# Patient Record
Sex: Female | Born: 1965 | Race: Black or African American | Hispanic: No | Marital: Single | Smoking: Current some day smoker
Health system: Southern US, Community
[De-identification: ages and names within clinical notes are randomized; demographics above are authoritative.]

## PROBLEM LIST (undated history)

## (undated) DIAGNOSIS — I10 Essential (primary) hypertension: Secondary | ICD-10-CM

## (undated) DIAGNOSIS — M5030 Other cervical disc degeneration, unspecified cervical region: Secondary | ICD-10-CM

## (undated) HISTORY — PX: JOINT REPLACEMENT: SHX530

---

## 1998-04-21 ENCOUNTER — Encounter: Payer: Self-pay | Admitting: Emergency Medicine

## 1998-04-21 ENCOUNTER — Emergency Department (HOSPITAL_COMMUNITY): Admission: EM | Admit: 1998-04-21 | Discharge: 1998-04-21 | Payer: Self-pay | Admitting: Emergency Medicine

## 1998-06-14 ENCOUNTER — Encounter: Payer: Self-pay | Admitting: Emergency Medicine

## 1998-06-14 ENCOUNTER — Emergency Department (HOSPITAL_COMMUNITY): Admission: EM | Admit: 1998-06-14 | Discharge: 1998-06-14 | Payer: Self-pay | Admitting: Emergency Medicine

## 1998-10-30 ENCOUNTER — Emergency Department (HOSPITAL_COMMUNITY): Admission: EM | Admit: 1998-10-30 | Discharge: 1998-10-30 | Payer: Self-pay | Admitting: Emergency Medicine

## 1998-11-27 ENCOUNTER — Emergency Department (HOSPITAL_COMMUNITY): Admission: EM | Admit: 1998-11-27 | Discharge: 1998-11-27 | Payer: Self-pay | Admitting: *Deleted

## 1998-12-05 ENCOUNTER — Emergency Department (HOSPITAL_COMMUNITY): Admission: EM | Admit: 1998-12-05 | Discharge: 1998-12-05 | Payer: Self-pay | Admitting: Internal Medicine

## 1999-05-11 ENCOUNTER — Emergency Department (HOSPITAL_COMMUNITY): Admission: EM | Admit: 1999-05-11 | Discharge: 1999-05-11 | Payer: Self-pay | Admitting: Emergency Medicine

## 1999-05-11 ENCOUNTER — Encounter: Payer: Self-pay | Admitting: Emergency Medicine

## 2000-02-14 ENCOUNTER — Encounter: Admission: RE | Admit: 2000-02-14 | Discharge: 2000-02-14 | Payer: Self-pay | Admitting: Hematology and Oncology

## 2000-02-23 ENCOUNTER — Emergency Department (HOSPITAL_COMMUNITY): Admission: EM | Admit: 2000-02-23 | Discharge: 2000-02-24 | Payer: Self-pay | Admitting: Emergency Medicine

## 2000-02-23 ENCOUNTER — Encounter: Payer: Self-pay | Admitting: Emergency Medicine

## 2000-02-28 ENCOUNTER — Encounter: Admission: RE | Admit: 2000-02-28 | Discharge: 2000-02-28 | Payer: Self-pay | Admitting: Hematology and Oncology

## 2000-04-08 ENCOUNTER — Encounter: Payer: Self-pay | Admitting: Emergency Medicine

## 2000-04-08 ENCOUNTER — Emergency Department (HOSPITAL_COMMUNITY): Admission: EM | Admit: 2000-04-08 | Discharge: 2000-04-08 | Payer: Self-pay | Admitting: Emergency Medicine

## 2000-04-16 ENCOUNTER — Other Ambulatory Visit: Admission: RE | Admit: 2000-04-16 | Discharge: 2000-04-16 | Payer: Self-pay | Admitting: Gynecology

## 2000-04-17 ENCOUNTER — Encounter: Admission: RE | Admit: 2000-04-17 | Discharge: 2000-04-17 | Payer: Self-pay | Admitting: Hematology and Oncology

## 2000-04-24 ENCOUNTER — Encounter: Payer: Self-pay | Admitting: Sports Medicine

## 2000-04-24 ENCOUNTER — Ambulatory Visit (HOSPITAL_COMMUNITY): Admission: RE | Admit: 2000-04-24 | Discharge: 2000-04-24 | Payer: Self-pay | Admitting: Sports Medicine

## 2000-04-30 ENCOUNTER — Other Ambulatory Visit: Admission: RE | Admit: 2000-04-30 | Discharge: 2000-04-30 | Payer: Self-pay | Admitting: Gynecology

## 2000-06-27 ENCOUNTER — Encounter: Admission: RE | Admit: 2000-06-27 | Discharge: 2000-06-27 | Payer: Self-pay | Admitting: Internal Medicine

## 2000-07-25 ENCOUNTER — Encounter: Admission: RE | Admit: 2000-07-25 | Discharge: 2000-07-25 | Payer: Self-pay | Admitting: Internal Medicine

## 2000-09-09 ENCOUNTER — Encounter: Admission: RE | Admit: 2000-09-09 | Discharge: 2000-09-09 | Payer: Self-pay | Admitting: Internal Medicine

## 2000-09-18 ENCOUNTER — Encounter: Admission: RE | Admit: 2000-09-18 | Discharge: 2000-09-18 | Payer: Self-pay | Admitting: Hematology and Oncology

## 2000-10-07 ENCOUNTER — Encounter: Admission: RE | Admit: 2000-10-07 | Discharge: 2000-10-07 | Payer: Self-pay | Admitting: Internal Medicine

## 2000-10-28 ENCOUNTER — Encounter: Admission: RE | Admit: 2000-10-28 | Discharge: 2000-10-28 | Payer: Self-pay | Admitting: Internal Medicine

## 2000-11-06 ENCOUNTER — Encounter: Admission: RE | Admit: 2000-11-06 | Discharge: 2000-11-06 | Payer: Self-pay | Admitting: Internal Medicine

## 2000-11-12 ENCOUNTER — Other Ambulatory Visit: Admission: RE | Admit: 2000-11-12 | Discharge: 2000-11-12 | Payer: Self-pay | Admitting: Gynecology

## 2002-01-17 ENCOUNTER — Emergency Department (HOSPITAL_COMMUNITY): Admission: EM | Admit: 2002-01-17 | Discharge: 2002-01-17 | Payer: Self-pay | Admitting: Emergency Medicine

## 2002-03-09 ENCOUNTER — Emergency Department (HOSPITAL_COMMUNITY): Admission: EM | Admit: 2002-03-09 | Discharge: 2002-03-09 | Payer: Self-pay | Admitting: *Deleted

## 2002-04-08 ENCOUNTER — Encounter: Admission: RE | Admit: 2002-04-08 | Discharge: 2002-04-08 | Payer: Self-pay | Admitting: Internal Medicine

## 2002-05-22 ENCOUNTER — Encounter: Admission: RE | Admit: 2002-05-22 | Discharge: 2002-05-22 | Payer: Self-pay | Admitting: Internal Medicine

## 2003-02-15 ENCOUNTER — Encounter: Admission: RE | Admit: 2003-02-15 | Discharge: 2003-02-15 | Payer: Self-pay | Admitting: Internal Medicine

## 2003-09-02 ENCOUNTER — Encounter: Admission: RE | Admit: 2003-09-02 | Discharge: 2003-09-02 | Payer: Self-pay | Admitting: Internal Medicine

## 2003-09-02 ENCOUNTER — Ambulatory Visit (HOSPITAL_COMMUNITY): Admission: RE | Admit: 2003-09-02 | Discharge: 2003-09-02 | Payer: Self-pay | Admitting: Internal Medicine

## 2003-09-27 ENCOUNTER — Emergency Department (HOSPITAL_COMMUNITY): Admission: EM | Admit: 2003-09-27 | Discharge: 2003-09-27 | Payer: Self-pay | Admitting: Family Medicine

## 2003-11-21 ENCOUNTER — Emergency Department (HOSPITAL_COMMUNITY): Admission: EM | Admit: 2003-11-21 | Discharge: 2003-11-21 | Payer: Self-pay | Admitting: Emergency Medicine

## 2003-11-24 ENCOUNTER — Emergency Department (HOSPITAL_COMMUNITY): Admission: EM | Admit: 2003-11-24 | Discharge: 2003-11-24 | Payer: Self-pay | Admitting: Emergency Medicine

## 2004-06-30 ENCOUNTER — Emergency Department (HOSPITAL_COMMUNITY): Admission: EM | Admit: 2004-06-30 | Discharge: 2004-06-30 | Payer: Self-pay | Admitting: Emergency Medicine

## 2004-12-23 ENCOUNTER — Inpatient Hospital Stay (HOSPITAL_COMMUNITY): Admission: EM | Admit: 2004-12-23 | Discharge: 2004-12-27 | Payer: Self-pay | Admitting: Emergency Medicine

## 2004-12-24 ENCOUNTER — Ambulatory Visit: Payer: Self-pay | Admitting: Internal Medicine

## 2006-02-24 IMAGING — CR DG CHEST 2V
2 series · 2 of 2 positions shown · non-contrast
Comparison: 11/21/03.

CLINICAL DATA: Abdominal pain, nausea.
 CHEST - 2 VIEW:

[view not recorded (1 of 2)]
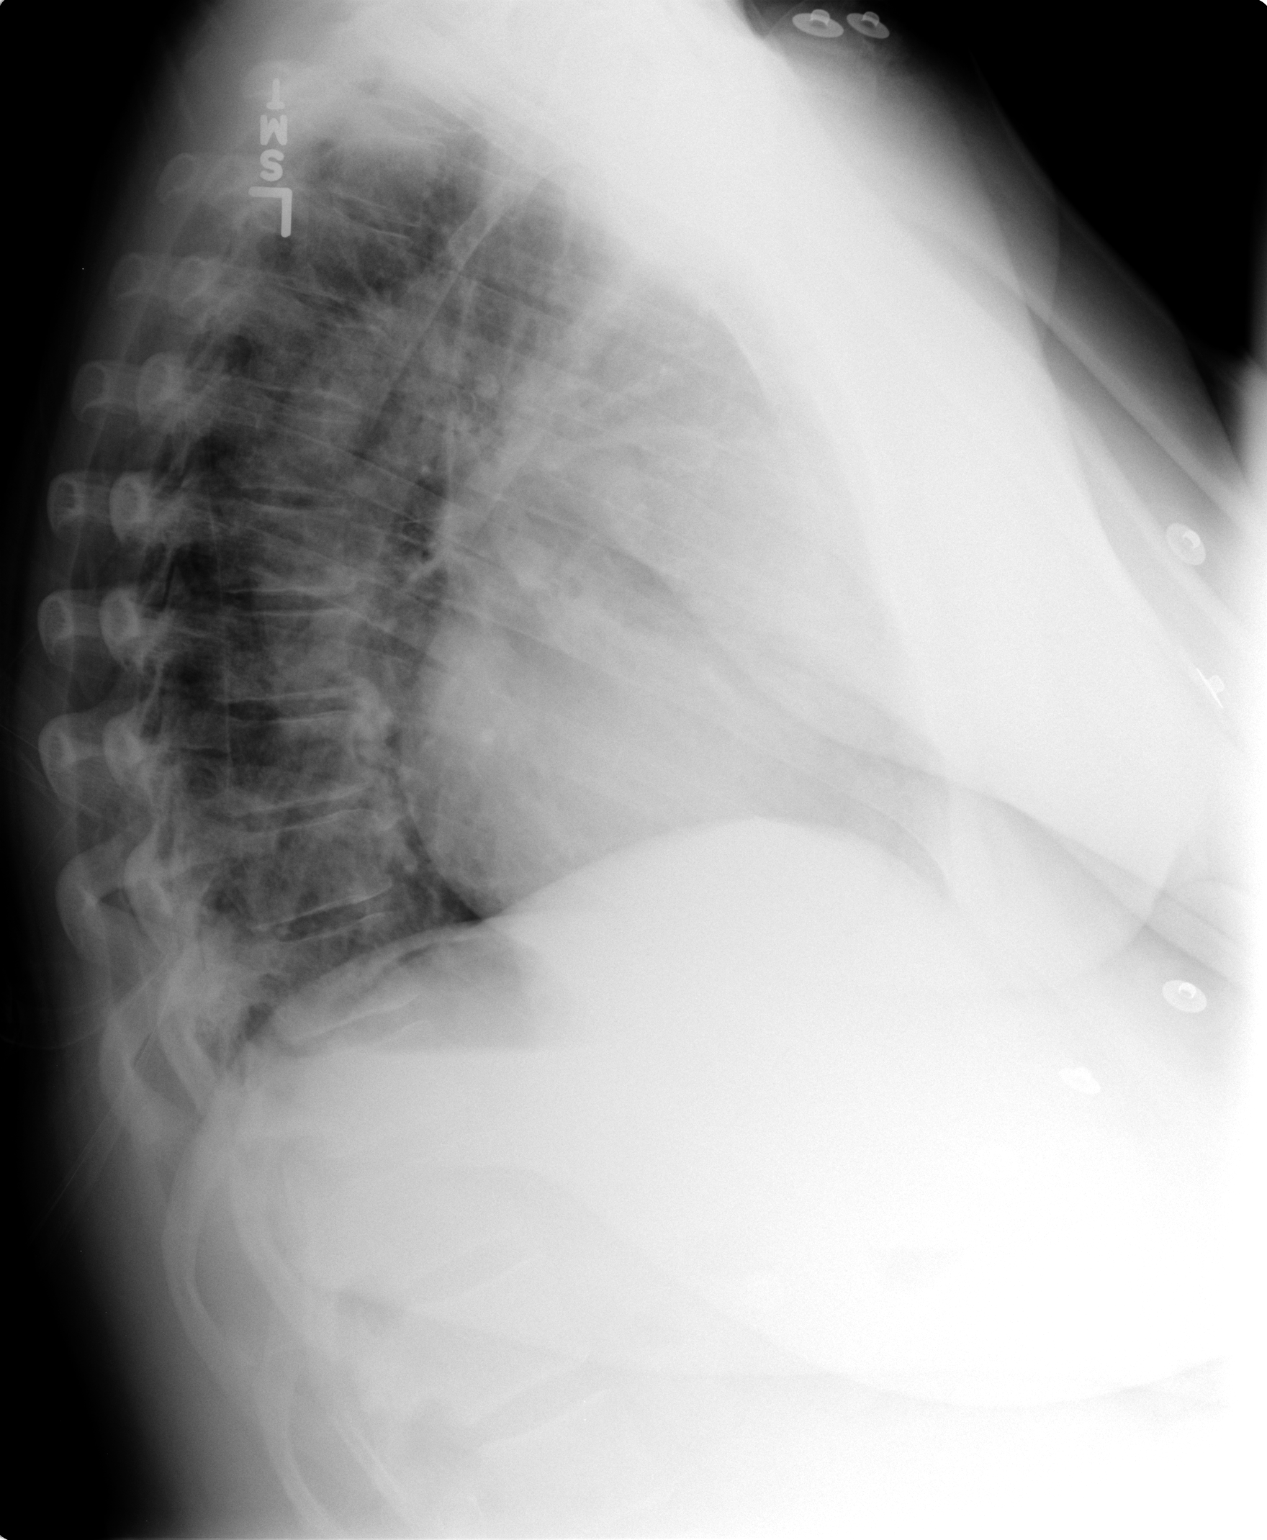

[view not recorded (2 of 2)]
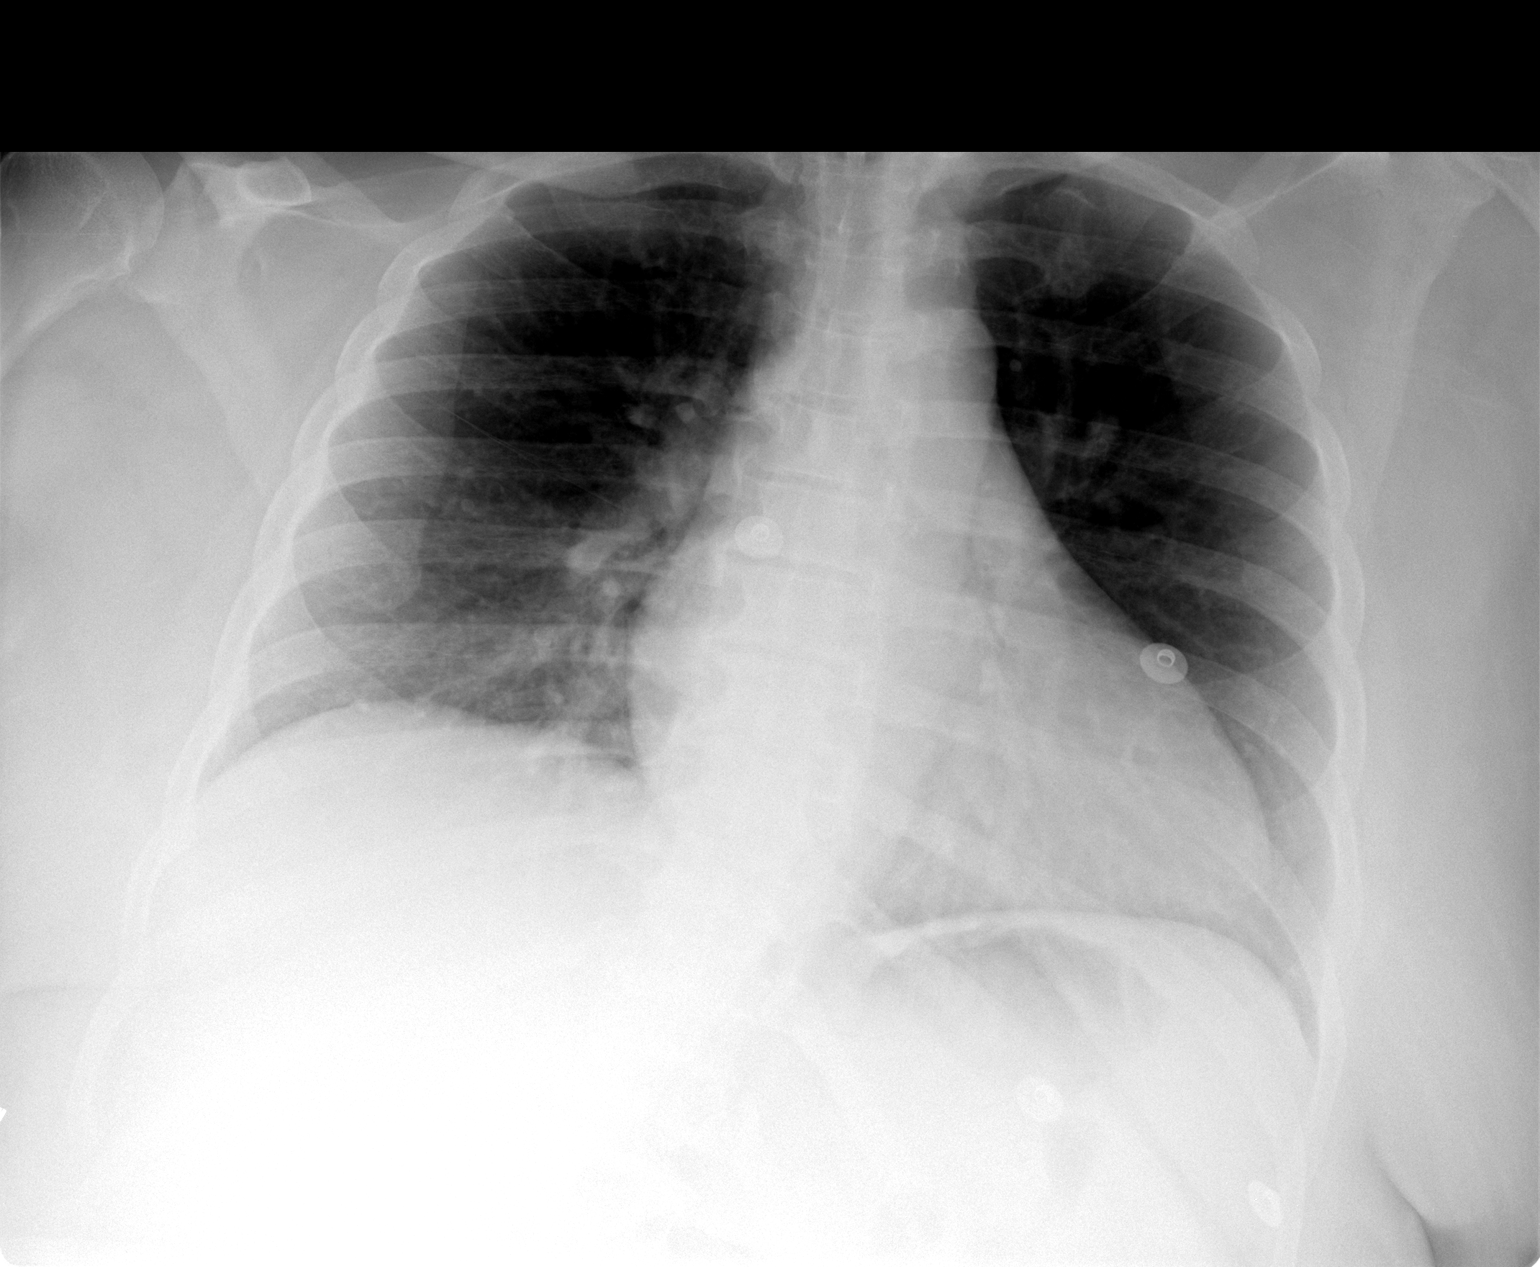

[2 of 2 positions shown; findings below may reference images not displayed]

There is stable cardiomegaly.  There are no infiltrative or edematous changes.  There are no mediastinal abnormalities.
IMPRESSION: Cardiomegaly ? stable.  No acute abnormalities.

## 2015-06-20 DIAGNOSIS — M545 Low back pain: Secondary | ICD-10-CM | POA: Diagnosis not present

## 2015-06-20 DIAGNOSIS — M170 Bilateral primary osteoarthritis of knee: Secondary | ICD-10-CM | POA: Diagnosis not present

## 2015-06-20 DIAGNOSIS — M25562 Pain in left knee: Secondary | ICD-10-CM | POA: Diagnosis not present

## 2015-06-20 DIAGNOSIS — M25561 Pain in right knee: Secondary | ICD-10-CM | POA: Diagnosis not present

## 2015-09-14 DIAGNOSIS — F99 Mental disorder, not otherwise specified: Secondary | ICD-10-CM | POA: Diagnosis not present

## 2015-09-14 DIAGNOSIS — F251 Schizoaffective disorder, depressive type: Secondary | ICD-10-CM | POA: Diagnosis not present

## 2015-09-14 DIAGNOSIS — F329 Major depressive disorder, single episode, unspecified: Secondary | ICD-10-CM | POA: Diagnosis not present

## 2015-09-14 DIAGNOSIS — F23 Brief psychotic disorder: Secondary | ICD-10-CM | POA: Diagnosis not present

## 2015-09-14 DIAGNOSIS — Z751 Person awaiting admission to adequate facility elsewhere: Secondary | ICD-10-CM | POA: Diagnosis not present

## 2015-09-15 DIAGNOSIS — F332 Major depressive disorder, recurrent severe without psychotic features: Secondary | ICD-10-CM | POA: Diagnosis present

## 2015-09-15 DIAGNOSIS — E669 Obesity, unspecified: Secondary | ICD-10-CM | POA: Diagnosis present

## 2015-09-15 DIAGNOSIS — I10 Essential (primary) hypertension: Secondary | ICD-10-CM | POA: Diagnosis present

## 2015-09-15 DIAGNOSIS — R45851 Suicidal ideations: Secondary | ICD-10-CM | POA: Diagnosis present

## 2015-09-15 DIAGNOSIS — F17210 Nicotine dependence, cigarettes, uncomplicated: Secondary | ICD-10-CM | POA: Diagnosis present

## 2015-09-15 DIAGNOSIS — K219 Gastro-esophageal reflux disease without esophagitis: Secondary | ICD-10-CM | POA: Diagnosis present

## 2015-09-15 DIAGNOSIS — Z751 Person awaiting admission to adequate facility elsewhere: Secondary | ICD-10-CM | POA: Diagnosis not present

## 2015-09-15 DIAGNOSIS — M519 Unspecified thoracic, thoracolumbar and lumbosacral intervertebral disc disorder: Secondary | ICD-10-CM | POA: Diagnosis present

## 2015-09-15 DIAGNOSIS — F329 Major depressive disorder, single episode, unspecified: Secondary | ICD-10-CM | POA: Diagnosis not present

## 2015-10-03 DIAGNOSIS — J40 Bronchitis, not specified as acute or chronic: Secondary | ICD-10-CM | POA: Diagnosis not present

## 2015-10-03 DIAGNOSIS — J309 Allergic rhinitis, unspecified: Secondary | ICD-10-CM | POA: Diagnosis not present

## 2015-10-03 DIAGNOSIS — I10 Essential (primary) hypertension: Secondary | ICD-10-CM | POA: Diagnosis not present

## 2015-10-10 DIAGNOSIS — R52 Pain, unspecified: Secondary | ICD-10-CM | POA: Diagnosis not present

## 2015-10-10 DIAGNOSIS — M25519 Pain in unspecified shoulder: Secondary | ICD-10-CM | POA: Diagnosis not present

## 2015-10-25 DIAGNOSIS — M19011 Primary osteoarthritis, right shoulder: Secondary | ICD-10-CM | POA: Diagnosis not present

## 2015-10-25 DIAGNOSIS — M19012 Primary osteoarthritis, left shoulder: Secondary | ICD-10-CM | POA: Diagnosis not present

## 2015-10-25 DIAGNOSIS — M7591 Shoulder lesion, unspecified, right shoulder: Secondary | ICD-10-CM | POA: Diagnosis not present

## 2015-10-28 DIAGNOSIS — M25519 Pain in unspecified shoulder: Secondary | ICD-10-CM | POA: Diagnosis not present

## 2015-10-28 DIAGNOSIS — R52 Pain, unspecified: Secondary | ICD-10-CM | POA: Diagnosis not present

## 2015-10-28 DIAGNOSIS — M25562 Pain in left knee: Secondary | ICD-10-CM | POA: Diagnosis not present

## 2015-10-31 DIAGNOSIS — M25562 Pain in left knee: Secondary | ICD-10-CM | POA: Diagnosis not present

## 2015-10-31 DIAGNOSIS — M25561 Pain in right knee: Secondary | ICD-10-CM | POA: Diagnosis not present

## 2015-10-31 DIAGNOSIS — M25512 Pain in left shoulder: Secondary | ICD-10-CM | POA: Diagnosis not present

## 2015-10-31 DIAGNOSIS — M25511 Pain in right shoulder: Secondary | ICD-10-CM | POA: Diagnosis not present

## 2015-11-04 DIAGNOSIS — F332 Major depressive disorder, recurrent severe without psychotic features: Secondary | ICD-10-CM | POA: Diagnosis not present

## 2015-11-07 DIAGNOSIS — M25562 Pain in left knee: Secondary | ICD-10-CM | POA: Diagnosis not present

## 2015-11-07 DIAGNOSIS — M170 Bilateral primary osteoarthritis of knee: Secondary | ICD-10-CM | POA: Diagnosis not present

## 2015-11-07 DIAGNOSIS — M25512 Pain in left shoulder: Secondary | ICD-10-CM | POA: Diagnosis not present

## 2015-11-07 DIAGNOSIS — M25561 Pain in right knee: Secondary | ICD-10-CM | POA: Diagnosis not present

## 2015-11-23 DIAGNOSIS — M25512 Pain in left shoulder: Secondary | ICD-10-CM | POA: Diagnosis not present

## 2015-11-23 DIAGNOSIS — M170 Bilateral primary osteoarthritis of knee: Secondary | ICD-10-CM | POA: Diagnosis not present

## 2015-11-23 DIAGNOSIS — M25511 Pain in right shoulder: Secondary | ICD-10-CM | POA: Diagnosis not present

## 2015-11-23 DIAGNOSIS — M25562 Pain in left knee: Secondary | ICD-10-CM | POA: Diagnosis not present

## 2015-11-23 DIAGNOSIS — M75101 Unspecified rotator cuff tear or rupture of right shoulder, not specified as traumatic: Secondary | ICD-10-CM | POA: Diagnosis not present

## 2015-11-23 DIAGNOSIS — M25561 Pain in right knee: Secondary | ICD-10-CM | POA: Diagnosis not present

## 2015-12-05 DIAGNOSIS — M2550 Pain in unspecified joint: Secondary | ICD-10-CM | POA: Diagnosis not present

## 2015-12-05 DIAGNOSIS — I10 Essential (primary) hypertension: Secondary | ICD-10-CM | POA: Diagnosis not present

## 2015-12-07 DIAGNOSIS — M25579 Pain in unspecified ankle and joints of unspecified foot: Secondary | ICD-10-CM | POA: Diagnosis not present

## 2015-12-07 DIAGNOSIS — M25571 Pain in right ankle and joints of right foot: Secondary | ICD-10-CM | POA: Diagnosis not present

## 2015-12-07 DIAGNOSIS — M19071 Primary osteoarthritis, right ankle and foot: Secondary | ICD-10-CM | POA: Diagnosis not present

## 2015-12-07 DIAGNOSIS — M19072 Primary osteoarthritis, left ankle and foot: Secondary | ICD-10-CM | POA: Diagnosis not present

## 2015-12-07 DIAGNOSIS — M25572 Pain in left ankle and joints of left foot: Secondary | ICD-10-CM | POA: Diagnosis not present

## 2015-12-15 DIAGNOSIS — M25512 Pain in left shoulder: Secondary | ICD-10-CM | POA: Diagnosis not present

## 2015-12-15 DIAGNOSIS — M25561 Pain in right knee: Secondary | ICD-10-CM | POA: Diagnosis not present

## 2015-12-15 DIAGNOSIS — M170 Bilateral primary osteoarthritis of knee: Secondary | ICD-10-CM | POA: Diagnosis not present

## 2015-12-15 DIAGNOSIS — M25562 Pain in left knee: Secondary | ICD-10-CM | POA: Diagnosis not present

## 2015-12-29 DIAGNOSIS — M1712 Unilateral primary osteoarthritis, left knee: Secondary | ICD-10-CM | POA: Diagnosis not present

## 2015-12-29 DIAGNOSIS — M1711 Unilateral primary osteoarthritis, right knee: Secondary | ICD-10-CM | POA: Diagnosis not present

## 2015-12-29 DIAGNOSIS — M25562 Pain in left knee: Secondary | ICD-10-CM | POA: Diagnosis not present

## 2015-12-29 DIAGNOSIS — M25561 Pain in right knee: Secondary | ICD-10-CM | POA: Diagnosis not present

## 2016-01-04 DIAGNOSIS — M1712 Unilateral primary osteoarthritis, left knee: Secondary | ICD-10-CM | POA: Diagnosis not present

## 2016-01-04 DIAGNOSIS — M1711 Unilateral primary osteoarthritis, right knee: Secondary | ICD-10-CM | POA: Diagnosis not present

## 2016-01-04 DIAGNOSIS — M62830 Muscle spasm of back: Secondary | ICD-10-CM | POA: Diagnosis not present

## 2016-01-13 DIAGNOSIS — M1711 Unilateral primary osteoarthritis, right knee: Secondary | ICD-10-CM | POA: Diagnosis not present

## 2016-01-13 DIAGNOSIS — M62830 Muscle spasm of back: Secondary | ICD-10-CM | POA: Diagnosis not present

## 2016-01-13 DIAGNOSIS — M1712 Unilateral primary osteoarthritis, left knee: Secondary | ICD-10-CM | POA: Diagnosis not present

## 2016-01-16 DIAGNOSIS — F332 Major depressive disorder, recurrent severe without psychotic features: Secondary | ICD-10-CM | POA: Diagnosis not present

## 2016-01-25 DIAGNOSIS — E860 Dehydration: Secondary | ICD-10-CM | POA: Diagnosis not present

## 2016-01-25 DIAGNOSIS — I169 Hypertensive crisis, unspecified: Secondary | ICD-10-CM | POA: Diagnosis not present

## 2016-01-25 DIAGNOSIS — N281 Cyst of kidney, acquired: Secondary | ICD-10-CM | POA: Diagnosis not present

## 2016-01-25 DIAGNOSIS — R1084 Generalized abdominal pain: Secondary | ICD-10-CM | POA: Diagnosis not present

## 2016-01-25 DIAGNOSIS — R112 Nausea with vomiting, unspecified: Secondary | ICD-10-CM | POA: Diagnosis not present

## 2016-01-25 DIAGNOSIS — Z6841 Body Mass Index (BMI) 40.0 and over, adult: Secondary | ICD-10-CM | POA: Diagnosis not present

## 2016-01-25 DIAGNOSIS — D7389 Other diseases of spleen: Secondary | ICD-10-CM | POA: Diagnosis not present

## 2016-01-25 DIAGNOSIS — K439 Ventral hernia without obstruction or gangrene: Secondary | ICD-10-CM | POA: Diagnosis not present

## 2016-01-25 DIAGNOSIS — R110 Nausea: Secondary | ICD-10-CM | POA: Diagnosis not present

## 2016-01-25 DIAGNOSIS — N390 Urinary tract infection, site not specified: Secondary | ICD-10-CM | POA: Diagnosis not present

## 2016-01-25 DIAGNOSIS — B9620 Unspecified Escherichia coli [E. coli] as the cause of diseases classified elsewhere: Secondary | ICD-10-CM | POA: Diagnosis not present

## 2016-01-25 DIAGNOSIS — I10 Essential (primary) hypertension: Secondary | ICD-10-CM | POA: Diagnosis not present

## 2016-01-25 DIAGNOSIS — R030 Elevated blood-pressure reading, without diagnosis of hypertension: Secondary | ICD-10-CM | POA: Diagnosis not present

## 2016-01-26 DIAGNOSIS — N390 Urinary tract infection, site not specified: Secondary | ICD-10-CM | POA: Diagnosis present

## 2016-01-26 DIAGNOSIS — Z888 Allergy status to other drugs, medicaments and biological substances status: Secondary | ICD-10-CM | POA: Diagnosis not present

## 2016-01-26 DIAGNOSIS — Z79899 Other long term (current) drug therapy: Secondary | ICD-10-CM | POA: Diagnosis not present

## 2016-01-26 DIAGNOSIS — R112 Nausea with vomiting, unspecified: Secondary | ICD-10-CM | POA: Diagnosis not present

## 2016-01-26 DIAGNOSIS — Z713 Dietary counseling and surveillance: Secondary | ICD-10-CM | POA: Diagnosis not present

## 2016-01-26 DIAGNOSIS — Z87891 Personal history of nicotine dependence: Secondary | ICD-10-CM | POA: Diagnosis not present

## 2016-01-26 DIAGNOSIS — M479 Spondylosis, unspecified: Secondary | ICD-10-CM | POA: Diagnosis present

## 2016-01-26 DIAGNOSIS — Z8249 Family history of ischemic heart disease and other diseases of the circulatory system: Secondary | ICD-10-CM | POA: Diagnosis not present

## 2016-01-26 DIAGNOSIS — B9620 Unspecified Escherichia coli [E. coli] as the cause of diseases classified elsewhere: Secondary | ICD-10-CM | POA: Diagnosis present

## 2016-01-26 DIAGNOSIS — I10 Essential (primary) hypertension: Secondary | ICD-10-CM | POA: Diagnosis present

## 2016-01-26 DIAGNOSIS — Z602 Problems related to living alone: Secondary | ICD-10-CM | POA: Diagnosis present

## 2016-01-26 DIAGNOSIS — I169 Hypertensive crisis, unspecified: Secondary | ICD-10-CM | POA: Diagnosis present

## 2016-01-26 DIAGNOSIS — Z6841 Body Mass Index (BMI) 40.0 and over, adult: Secondary | ICD-10-CM | POA: Diagnosis not present

## 2016-01-26 DIAGNOSIS — F418 Other specified anxiety disorders: Secondary | ICD-10-CM | POA: Diagnosis present

## 2016-02-22 DIAGNOSIS — N289 Disorder of kidney and ureter, unspecified: Secondary | ICD-10-CM | POA: Diagnosis not present

## 2016-02-22 DIAGNOSIS — I10 Essential (primary) hypertension: Secondary | ICD-10-CM | POA: Diagnosis not present

## 2016-02-22 DIAGNOSIS — R1111 Vomiting without nausea: Secondary | ICD-10-CM | POA: Diagnosis not present

## 2016-02-22 DIAGNOSIS — Z888 Allergy status to other drugs, medicaments and biological substances status: Secondary | ICD-10-CM | POA: Diagnosis not present

## 2016-02-22 DIAGNOSIS — R197 Diarrhea, unspecified: Secondary | ICD-10-CM | POA: Diagnosis not present

## 2016-02-22 DIAGNOSIS — N281 Cyst of kidney, acquired: Secondary | ICD-10-CM | POA: Diagnosis not present

## 2016-02-22 DIAGNOSIS — R112 Nausea with vomiting, unspecified: Secondary | ICD-10-CM | POA: Diagnosis not present

## 2016-02-22 DIAGNOSIS — Z79899 Other long term (current) drug therapy: Secondary | ICD-10-CM | POA: Diagnosis not present

## 2016-02-22 DIAGNOSIS — R030 Elevated blood-pressure reading, without diagnosis of hypertension: Secondary | ICD-10-CM | POA: Diagnosis not present

## 2016-02-22 DIAGNOSIS — K439 Ventral hernia without obstruction or gangrene: Secondary | ICD-10-CM | POA: Diagnosis not present

## 2016-02-22 DIAGNOSIS — R1013 Epigastric pain: Secondary | ICD-10-CM | POA: Diagnosis not present

## 2016-03-03 DIAGNOSIS — Z79899 Other long term (current) drug therapy: Secondary | ICD-10-CM | POA: Diagnosis not present

## 2016-03-03 DIAGNOSIS — R45851 Suicidal ideations: Secondary | ICD-10-CM | POA: Diagnosis not present

## 2016-03-03 DIAGNOSIS — M25559 Pain in unspecified hip: Secondary | ICD-10-CM | POA: Diagnosis not present

## 2016-03-03 DIAGNOSIS — E669 Obesity, unspecified: Secondary | ICD-10-CM | POA: Diagnosis not present

## 2016-03-03 DIAGNOSIS — R1013 Epigastric pain: Secondary | ICD-10-CM | POA: Diagnosis not present

## 2016-03-03 DIAGNOSIS — M79606 Pain in leg, unspecified: Secondary | ICD-10-CM | POA: Diagnosis not present

## 2016-03-03 DIAGNOSIS — R112 Nausea with vomiting, unspecified: Secondary | ICD-10-CM | POA: Diagnosis not present

## 2016-03-03 DIAGNOSIS — F329 Major depressive disorder, single episode, unspecified: Secondary | ICD-10-CM | POA: Diagnosis not present

## 2016-03-03 DIAGNOSIS — I10 Essential (primary) hypertension: Secondary | ICD-10-CM | POA: Diagnosis not present

## 2016-03-03 DIAGNOSIS — R1110 Vomiting, unspecified: Secondary | ICD-10-CM | POA: Diagnosis not present

## 2016-03-03 DIAGNOSIS — Z9109 Other allergy status, other than to drugs and biological substances: Secondary | ICD-10-CM | POA: Diagnosis not present

## 2016-03-03 DIAGNOSIS — K2970 Gastritis, unspecified, without bleeding: Secondary | ICD-10-CM | POA: Diagnosis not present

## 2016-03-03 DIAGNOSIS — R197 Diarrhea, unspecified: Secondary | ICD-10-CM | POA: Diagnosis not present

## 2016-03-05 DIAGNOSIS — F315 Bipolar disorder, current episode depressed, severe, with psychotic features: Secondary | ICD-10-CM | POA: Diagnosis present

## 2016-03-05 DIAGNOSIS — R1110 Vomiting, unspecified: Secondary | ICD-10-CM | POA: Diagnosis not present

## 2016-03-05 DIAGNOSIS — R197 Diarrhea, unspecified: Secondary | ICD-10-CM | POA: Diagnosis not present

## 2016-03-05 DIAGNOSIS — R45851 Suicidal ideations: Secondary | ICD-10-CM | POA: Diagnosis present

## 2016-03-05 DIAGNOSIS — Z62810 Personal history of physical and sexual abuse in childhood: Secondary | ICD-10-CM | POA: Diagnosis present

## 2016-03-05 DIAGNOSIS — F332 Major depressive disorder, recurrent severe without psychotic features: Secondary | ICD-10-CM | POA: Diagnosis not present

## 2016-03-05 DIAGNOSIS — F329 Major depressive disorder, single episode, unspecified: Secondary | ICD-10-CM | POA: Diagnosis not present

## 2016-03-05 DIAGNOSIS — M1990 Unspecified osteoarthritis, unspecified site: Secondary | ICD-10-CM | POA: Diagnosis present

## 2016-03-05 DIAGNOSIS — K219 Gastro-esophageal reflux disease without esophagitis: Secondary | ICD-10-CM | POA: Diagnosis present

## 2016-03-05 DIAGNOSIS — M069 Rheumatoid arthritis, unspecified: Secondary | ICD-10-CM | POA: Diagnosis present

## 2016-03-05 DIAGNOSIS — R1013 Epigastric pain: Secondary | ICD-10-CM | POA: Diagnosis not present

## 2016-03-05 DIAGNOSIS — I10 Essential (primary) hypertension: Secondary | ICD-10-CM | POA: Diagnosis present

## 2016-03-15 DIAGNOSIS — M1712 Unilateral primary osteoarthritis, left knee: Secondary | ICD-10-CM | POA: Diagnosis not present

## 2016-03-15 DIAGNOSIS — M62830 Muscle spasm of back: Secondary | ICD-10-CM | POA: Diagnosis not present

## 2016-03-15 DIAGNOSIS — M25561 Pain in right knee: Secondary | ICD-10-CM | POA: Diagnosis not present

## 2016-03-15 DIAGNOSIS — M1711 Unilateral primary osteoarthritis, right knee: Secondary | ICD-10-CM | POA: Diagnosis not present

## 2016-03-15 DIAGNOSIS — M25562 Pain in left knee: Secondary | ICD-10-CM | POA: Diagnosis not present

## 2016-03-15 DIAGNOSIS — M479 Spondylosis, unspecified: Secondary | ICD-10-CM | POA: Diagnosis not present

## 2016-04-09 DIAGNOSIS — M25561 Pain in right knee: Secondary | ICD-10-CM | POA: Diagnosis not present

## 2016-04-09 DIAGNOSIS — M479 Spondylosis, unspecified: Secondary | ICD-10-CM | POA: Diagnosis not present

## 2016-04-09 DIAGNOSIS — M1712 Unilateral primary osteoarthritis, left knee: Secondary | ICD-10-CM | POA: Diagnosis not present

## 2016-04-09 DIAGNOSIS — M62830 Muscle spasm of back: Secondary | ICD-10-CM | POA: Diagnosis not present

## 2016-04-09 DIAGNOSIS — M25562 Pain in left knee: Secondary | ICD-10-CM | POA: Diagnosis not present

## 2016-04-09 DIAGNOSIS — M1711 Unilateral primary osteoarthritis, right knee: Secondary | ICD-10-CM | POA: Diagnosis not present

## 2016-04-16 DIAGNOSIS — F3341 Major depressive disorder, recurrent, in partial remission: Secondary | ICD-10-CM | POA: Diagnosis not present

## 2016-05-09 DIAGNOSIS — R202 Paresthesia of skin: Secondary | ICD-10-CM | POA: Diagnosis not present

## 2016-05-09 DIAGNOSIS — S139XXA Sprain of joints and ligaments of unspecified parts of neck, initial encounter: Secondary | ICD-10-CM | POA: Diagnosis not present

## 2016-05-09 DIAGNOSIS — M25562 Pain in left knee: Secondary | ICD-10-CM | POA: Diagnosis not present

## 2016-05-09 DIAGNOSIS — M479 Spondylosis, unspecified: Secondary | ICD-10-CM | POA: Diagnosis not present

## 2016-05-09 DIAGNOSIS — G894 Chronic pain syndrome: Secondary | ICD-10-CM | POA: Diagnosis not present

## 2016-05-09 DIAGNOSIS — M25561 Pain in right knee: Secondary | ICD-10-CM | POA: Diagnosis not present

## 2016-05-23 DIAGNOSIS — H16223 Keratoconjunctivitis sicca, not specified as Sjogren's, bilateral: Secondary | ICD-10-CM | POA: Diagnosis not present

## 2016-05-23 DIAGNOSIS — H25813 Combined forms of age-related cataract, bilateral: Secondary | ICD-10-CM | POA: Diagnosis not present

## 2016-05-23 DIAGNOSIS — H40013 Open angle with borderline findings, low risk, bilateral: Secondary | ICD-10-CM | POA: Diagnosis not present

## 2016-05-24 DIAGNOSIS — H2513 Age-related nuclear cataract, bilateral: Secondary | ICD-10-CM | POA: Diagnosis not present

## 2016-05-24 DIAGNOSIS — H16223 Keratoconjunctivitis sicca, not specified as Sjogren's, bilateral: Secondary | ICD-10-CM | POA: Diagnosis not present

## 2016-05-24 DIAGNOSIS — H40013 Open angle with borderline findings, low risk, bilateral: Secondary | ICD-10-CM | POA: Diagnosis not present

## 2016-06-13 DIAGNOSIS — M479 Spondylosis, unspecified: Secondary | ICD-10-CM | POA: Diagnosis not present

## 2016-06-13 DIAGNOSIS — M1711 Unilateral primary osteoarthritis, right knee: Secondary | ICD-10-CM | POA: Diagnosis not present

## 2016-06-13 DIAGNOSIS — M1712 Unilateral primary osteoarthritis, left knee: Secondary | ICD-10-CM | POA: Diagnosis not present

## 2016-06-17 DIAGNOSIS — Z3A32 32 weeks gestation of pregnancy: Secondary | ICD-10-CM | POA: Diagnosis not present

## 2016-06-17 DIAGNOSIS — K5792 Diverticulitis of intestine, part unspecified, without perforation or abscess without bleeding: Secondary | ICD-10-CM | POA: Diagnosis not present

## 2016-06-17 DIAGNOSIS — F22 Delusional disorders: Secondary | ICD-10-CM | POA: Diagnosis not present

## 2016-06-17 DIAGNOSIS — N281 Cyst of kidney, acquired: Secondary | ICD-10-CM | POA: Diagnosis not present

## 2016-06-17 DIAGNOSIS — E669 Obesity, unspecified: Secondary | ICD-10-CM | POA: Diagnosis not present

## 2016-06-17 DIAGNOSIS — Z888 Allergy status to other drugs, medicaments and biological substances status: Secondary | ICD-10-CM | POA: Diagnosis not present

## 2016-06-17 DIAGNOSIS — O2690 Pregnancy related conditions, unspecified, unspecified trimester: Secondary | ICD-10-CM | POA: Diagnosis not present

## 2016-06-17 DIAGNOSIS — F329 Major depressive disorder, single episode, unspecified: Secondary | ICD-10-CM | POA: Diagnosis not present

## 2016-06-17 DIAGNOSIS — Z886 Allergy status to analgesic agent status: Secondary | ICD-10-CM | POA: Diagnosis not present

## 2016-06-17 DIAGNOSIS — O99343 Other mental disorders complicating pregnancy, third trimester: Secondary | ICD-10-CM | POA: Diagnosis not present

## 2016-06-17 DIAGNOSIS — O99613 Diseases of the digestive system complicating pregnancy, third trimester: Secondary | ICD-10-CM | POA: Diagnosis not present

## 2016-06-17 DIAGNOSIS — I10 Essential (primary) hypertension: Secondary | ICD-10-CM | POA: Diagnosis not present

## 2016-06-17 DIAGNOSIS — F23 Brief psychotic disorder: Secondary | ICD-10-CM | POA: Diagnosis not present

## 2016-06-17 DIAGNOSIS — F29 Unspecified psychosis not due to a substance or known physiological condition: Secondary | ICD-10-CM | POA: Diagnosis not present

## 2016-06-17 DIAGNOSIS — R109 Unspecified abdominal pain: Secondary | ICD-10-CM | POA: Diagnosis not present

## 2016-06-17 DIAGNOSIS — Z79899 Other long term (current) drug therapy: Secondary | ICD-10-CM | POA: Diagnosis not present

## 2016-06-17 DIAGNOSIS — K5730 Diverticulosis of large intestine without perforation or abscess without bleeding: Secondary | ICD-10-CM | POA: Diagnosis not present

## 2016-06-18 DIAGNOSIS — K5792 Diverticulitis of intestine, part unspecified, without perforation or abscess without bleeding: Secondary | ICD-10-CM | POA: Diagnosis present

## 2016-06-18 DIAGNOSIS — I10 Essential (primary) hypertension: Secondary | ICD-10-CM | POA: Diagnosis not present

## 2016-06-18 DIAGNOSIS — G8929 Other chronic pain: Secondary | ICD-10-CM | POA: Diagnosis not present

## 2016-06-18 DIAGNOSIS — M549 Dorsalgia, unspecified: Secondary | ICD-10-CM | POA: Diagnosis not present

## 2016-06-18 DIAGNOSIS — F22 Delusional disorders: Secondary | ICD-10-CM | POA: Diagnosis not present

## 2016-06-18 DIAGNOSIS — Z6841 Body Mass Index (BMI) 40.0 and over, adult: Secondary | ICD-10-CM | POA: Diagnosis not present

## 2016-06-18 DIAGNOSIS — R109 Unspecified abdominal pain: Secondary | ICD-10-CM | POA: Diagnosis not present

## 2016-06-18 DIAGNOSIS — F329 Major depressive disorder, single episode, unspecified: Secondary | ICD-10-CM | POA: Diagnosis not present

## 2016-06-18 DIAGNOSIS — K219 Gastro-esophageal reflux disease without esophagitis: Secondary | ICD-10-CM | POA: Diagnosis present

## 2016-06-18 DIAGNOSIS — M5136 Other intervertebral disc degeneration, lumbar region: Secondary | ICD-10-CM | POA: Diagnosis present

## 2016-06-18 DIAGNOSIS — I119 Hypertensive heart disease without heart failure: Secondary | ICD-10-CM | POA: Diagnosis present

## 2016-06-18 DIAGNOSIS — F29 Unspecified psychosis not due to a substance or known physiological condition: Secondary | ICD-10-CM | POA: Diagnosis present

## 2016-06-18 DIAGNOSIS — M1990 Unspecified osteoarthritis, unspecified site: Secondary | ICD-10-CM | POA: Diagnosis present

## 2016-07-12 DIAGNOSIS — M1711 Unilateral primary osteoarthritis, right knee: Secondary | ICD-10-CM | POA: Diagnosis not present

## 2016-07-12 DIAGNOSIS — M1712 Unilateral primary osteoarthritis, left knee: Secondary | ICD-10-CM | POA: Diagnosis not present

## 2016-07-12 DIAGNOSIS — M25562 Pain in left knee: Secondary | ICD-10-CM | POA: Diagnosis not present

## 2016-07-12 DIAGNOSIS — M25561 Pain in right knee: Secondary | ICD-10-CM | POA: Diagnosis not present

## 2016-08-09 DIAGNOSIS — M479 Spondylosis, unspecified: Secondary | ICD-10-CM | POA: Diagnosis not present

## 2016-08-09 DIAGNOSIS — M25562 Pain in left knee: Secondary | ICD-10-CM | POA: Diagnosis not present

## 2016-08-09 DIAGNOSIS — M25561 Pain in right knee: Secondary | ICD-10-CM | POA: Diagnosis not present

## 2016-08-09 DIAGNOSIS — M1711 Unilateral primary osteoarthritis, right knee: Secondary | ICD-10-CM | POA: Diagnosis not present

## 2016-08-09 DIAGNOSIS — M1712 Unilateral primary osteoarthritis, left knee: Secondary | ICD-10-CM | POA: Diagnosis not present

## 2016-08-09 DIAGNOSIS — M542 Cervicalgia: Secondary | ICD-10-CM | POA: Diagnosis not present

## 2016-08-31 DIAGNOSIS — K529 Noninfective gastroenteritis and colitis, unspecified: Secondary | ICD-10-CM | POA: Diagnosis not present

## 2016-08-31 DIAGNOSIS — R1084 Generalized abdominal pain: Secondary | ICD-10-CM | POA: Diagnosis not present

## 2016-08-31 DIAGNOSIS — R112 Nausea with vomiting, unspecified: Secondary | ICD-10-CM | POA: Diagnosis not present

## 2016-08-31 DIAGNOSIS — K5730 Diverticulosis of large intestine without perforation or abscess without bleeding: Secondary | ICD-10-CM | POA: Diagnosis not present

## 2016-08-31 DIAGNOSIS — Z6841 Body Mass Index (BMI) 40.0 and over, adult: Secondary | ICD-10-CM | POA: Diagnosis not present

## 2016-08-31 DIAGNOSIS — I10 Essential (primary) hypertension: Secondary | ICD-10-CM | POA: Diagnosis not present

## 2016-08-31 DIAGNOSIS — M545 Low back pain: Secondary | ICD-10-CM | POA: Diagnosis not present

## 2016-08-31 DIAGNOSIS — E876 Hypokalemia: Secondary | ICD-10-CM | POA: Diagnosis not present

## 2016-08-31 DIAGNOSIS — N289 Disorder of kidney and ureter, unspecified: Secondary | ICD-10-CM | POA: Diagnosis not present

## 2016-09-02 DIAGNOSIS — F319 Bipolar disorder, unspecified: Secondary | ICD-10-CM | POA: Diagnosis present

## 2016-09-02 DIAGNOSIS — G8929 Other chronic pain: Secondary | ICD-10-CM | POA: Diagnosis present

## 2016-09-02 DIAGNOSIS — Z9109 Other allergy status, other than to drugs and biological substances: Secondary | ICD-10-CM | POA: Diagnosis not present

## 2016-09-02 DIAGNOSIS — F329 Major depressive disorder, single episode, unspecified: Secondary | ICD-10-CM | POA: Diagnosis not present

## 2016-09-02 DIAGNOSIS — I10 Essential (primary) hypertension: Secondary | ICD-10-CM | POA: Diagnosis present

## 2016-09-02 DIAGNOSIS — Z6841 Body Mass Index (BMI) 40.0 and over, adult: Secondary | ICD-10-CM | POA: Diagnosis not present

## 2016-09-02 DIAGNOSIS — M545 Low back pain: Secondary | ICD-10-CM | POA: Diagnosis present

## 2016-09-02 DIAGNOSIS — E876 Hypokalemia: Secondary | ICD-10-CM | POA: Diagnosis present

## 2016-09-02 DIAGNOSIS — Z79899 Other long term (current) drug therapy: Secondary | ICD-10-CM | POA: Diagnosis not present

## 2016-09-02 DIAGNOSIS — K529 Noninfective gastroenteritis and colitis, unspecified: Secondary | ICD-10-CM | POA: Diagnosis not present

## 2016-09-12 DIAGNOSIS — S8991XA Unspecified injury of right lower leg, initial encounter: Secondary | ICD-10-CM | POA: Diagnosis not present

## 2016-09-12 DIAGNOSIS — M479 Spondylosis, unspecified: Secondary | ICD-10-CM | POA: Diagnosis not present

## 2016-09-12 DIAGNOSIS — G894 Chronic pain syndrome: Secondary | ICD-10-CM | POA: Diagnosis not present

## 2016-09-12 DIAGNOSIS — Z79899 Other long term (current) drug therapy: Secondary | ICD-10-CM | POA: Diagnosis not present

## 2016-09-12 DIAGNOSIS — M25512 Pain in left shoulder: Secondary | ICD-10-CM | POA: Diagnosis not present

## 2016-09-12 DIAGNOSIS — M25561 Pain in right knee: Secondary | ICD-10-CM | POA: Diagnosis not present

## 2016-09-12 DIAGNOSIS — M25511 Pain in right shoulder: Secondary | ICD-10-CM | POA: Diagnosis not present

## 2016-09-12 DIAGNOSIS — M25562 Pain in left knee: Secondary | ICD-10-CM | POA: Diagnosis not present

## 2016-10-01 DIAGNOSIS — R451 Restlessness and agitation: Secondary | ICD-10-CM | POA: Diagnosis not present

## 2016-10-01 DIAGNOSIS — I517 Cardiomegaly: Secondary | ICD-10-CM | POA: Diagnosis not present

## 2016-10-01 DIAGNOSIS — R079 Chest pain, unspecified: Secondary | ICD-10-CM | POA: Diagnosis not present

## 2016-10-01 DIAGNOSIS — R45851 Suicidal ideations: Secondary | ICD-10-CM | POA: Diagnosis not present

## 2016-10-01 DIAGNOSIS — E669 Obesity, unspecified: Secondary | ICD-10-CM | POA: Diagnosis not present

## 2016-10-01 DIAGNOSIS — I482 Chronic atrial fibrillation: Secondary | ICD-10-CM | POA: Diagnosis not present

## 2016-10-01 DIAGNOSIS — R0989 Other specified symptoms and signs involving the circulatory and respiratory systems: Secondary | ICD-10-CM | POA: Diagnosis not present

## 2016-10-01 DIAGNOSIS — Z9114 Patient's other noncompliance with medication regimen: Secondary | ICD-10-CM | POA: Diagnosis not present

## 2016-10-01 DIAGNOSIS — R441 Visual hallucinations: Secondary | ICD-10-CM | POA: Diagnosis not present

## 2016-10-01 DIAGNOSIS — F329 Major depressive disorder, single episode, unspecified: Secondary | ICD-10-CM | POA: Diagnosis not present

## 2016-10-01 DIAGNOSIS — F99 Mental disorder, not otherwise specified: Secondary | ICD-10-CM | POA: Diagnosis not present

## 2016-10-01 DIAGNOSIS — F419 Anxiety disorder, unspecified: Secondary | ICD-10-CM | POA: Diagnosis not present

## 2016-10-01 DIAGNOSIS — I10 Essential (primary) hypertension: Secondary | ICD-10-CM | POA: Diagnosis not present

## 2016-10-01 DIAGNOSIS — F068 Other specified mental disorders due to known physiological condition: Secondary | ICD-10-CM | POA: Diagnosis not present

## 2016-10-01 DIAGNOSIS — F918 Other conduct disorders: Secondary | ICD-10-CM | POA: Diagnosis not present

## 2016-10-01 DIAGNOSIS — R0789 Other chest pain: Secondary | ICD-10-CM | POA: Diagnosis not present

## 2016-10-02 DIAGNOSIS — F251 Schizoaffective disorder, depressive type: Secondary | ICD-10-CM | POA: Diagnosis not present

## 2016-10-02 DIAGNOSIS — R079 Chest pain, unspecified: Secondary | ICD-10-CM | POA: Diagnosis not present

## 2016-10-04 DIAGNOSIS — R0789 Other chest pain: Secondary | ICD-10-CM | POA: Diagnosis not present

## 2016-10-04 DIAGNOSIS — I10 Essential (primary) hypertension: Secondary | ICD-10-CM | POA: Diagnosis not present

## 2016-10-04 DIAGNOSIS — Z79899 Other long term (current) drug therapy: Secondary | ICD-10-CM | POA: Diagnosis not present

## 2016-10-04 DIAGNOSIS — Z888 Allergy status to other drugs, medicaments and biological substances status: Secondary | ICD-10-CM | POA: Diagnosis not present

## 2016-10-04 DIAGNOSIS — G8921 Chronic pain due to trauma: Secondary | ICD-10-CM | POA: Diagnosis not present

## 2016-10-04 DIAGNOSIS — R161 Splenomegaly, not elsewhere classified: Secondary | ICD-10-CM | POA: Diagnosis not present

## 2016-10-04 DIAGNOSIS — Z6841 Body Mass Index (BMI) 40.0 and over, adult: Secondary | ICD-10-CM | POA: Diagnosis not present

## 2016-10-04 DIAGNOSIS — F419 Anxiety disorder, unspecified: Secondary | ICD-10-CM | POA: Diagnosis not present

## 2016-10-04 DIAGNOSIS — E876 Hypokalemia: Secondary | ICD-10-CM | POA: Diagnosis not present

## 2016-10-04 DIAGNOSIS — F209 Schizophrenia, unspecified: Secondary | ICD-10-CM | POA: Diagnosis not present

## 2016-10-04 DIAGNOSIS — R110 Nausea: Secondary | ICD-10-CM | POA: Diagnosis not present

## 2016-10-04 DIAGNOSIS — N281 Cyst of kidney, acquired: Secondary | ICD-10-CM | POA: Diagnosis not present

## 2016-10-04 DIAGNOSIS — I4581 Long QT syndrome: Secondary | ICD-10-CM | POA: Diagnosis not present

## 2016-10-04 DIAGNOSIS — R0602 Shortness of breath: Secondary | ICD-10-CM | POA: Diagnosis not present

## 2016-10-05 DIAGNOSIS — F209 Schizophrenia, unspecified: Secondary | ICD-10-CM | POA: Diagnosis not present

## 2016-10-05 DIAGNOSIS — R9431 Abnormal electrocardiogram [ECG] [EKG]: Secondary | ICD-10-CM | POA: Diagnosis not present

## 2016-10-05 DIAGNOSIS — R0602 Shortness of breath: Secondary | ICD-10-CM | POA: Diagnosis not present

## 2016-10-05 DIAGNOSIS — R0789 Other chest pain: Secondary | ICD-10-CM | POA: Diagnosis not present

## 2016-10-05 DIAGNOSIS — I10 Essential (primary) hypertension: Secondary | ICD-10-CM | POA: Diagnosis not present

## 2016-10-05 DIAGNOSIS — F419 Anxiety disorder, unspecified: Secondary | ICD-10-CM | POA: Diagnosis not present

## 2016-10-05 DIAGNOSIS — R079 Chest pain, unspecified: Secondary | ICD-10-CM | POA: Diagnosis not present

## 2016-10-05 DIAGNOSIS — R161 Splenomegaly, not elsewhere classified: Secondary | ICD-10-CM | POA: Diagnosis not present

## 2016-10-06 DIAGNOSIS — I10 Essential (primary) hypertension: Secondary | ICD-10-CM | POA: Diagnosis not present

## 2016-10-06 DIAGNOSIS — F419 Anxiety disorder, unspecified: Secondary | ICD-10-CM | POA: Diagnosis not present

## 2016-10-06 DIAGNOSIS — R0789 Other chest pain: Secondary | ICD-10-CM | POA: Diagnosis not present

## 2016-10-06 DIAGNOSIS — R9431 Abnormal electrocardiogram [ECG] [EKG]: Secondary | ICD-10-CM | POA: Diagnosis not present

## 2016-10-06 DIAGNOSIS — R161 Splenomegaly, not elsewhere classified: Secondary | ICD-10-CM | POA: Diagnosis not present

## 2016-10-06 DIAGNOSIS — F209 Schizophrenia, unspecified: Secondary | ICD-10-CM | POA: Diagnosis not present

## 2016-10-07 DIAGNOSIS — R0789 Other chest pain: Secondary | ICD-10-CM | POA: Diagnosis not present

## 2016-10-07 DIAGNOSIS — R9431 Abnormal electrocardiogram [ECG] [EKG]: Secondary | ICD-10-CM | POA: Diagnosis not present

## 2016-10-07 DIAGNOSIS — F209 Schizophrenia, unspecified: Secondary | ICD-10-CM | POA: Diagnosis not present

## 2016-10-07 DIAGNOSIS — I10 Essential (primary) hypertension: Secondary | ICD-10-CM | POA: Diagnosis not present

## 2016-10-07 DIAGNOSIS — F419 Anxiety disorder, unspecified: Secondary | ICD-10-CM | POA: Diagnosis not present

## 2016-10-07 DIAGNOSIS — R161 Splenomegaly, not elsewhere classified: Secondary | ICD-10-CM | POA: Diagnosis not present

## 2016-10-08 DIAGNOSIS — I10 Essential (primary) hypertension: Secondary | ICD-10-CM | POA: Diagnosis not present

## 2016-10-08 DIAGNOSIS — F1411 Cocaine abuse, in remission: Secondary | ICD-10-CM | POA: Diagnosis not present

## 2016-10-08 DIAGNOSIS — Z590 Homelessness: Secondary | ICD-10-CM | POA: Diagnosis not present

## 2016-10-08 DIAGNOSIS — M6281 Muscle weakness (generalized): Secondary | ICD-10-CM | POA: Diagnosis not present

## 2016-10-08 DIAGNOSIS — E876 Hypokalemia: Secondary | ICD-10-CM | POA: Diagnosis not present

## 2016-10-08 DIAGNOSIS — F29 Unspecified psychosis not due to a substance or known physiological condition: Secondary | ICD-10-CM | POA: Diagnosis not present

## 2016-10-08 DIAGNOSIS — F1211 Cannabis abuse, in remission: Secondary | ICD-10-CM | POA: Diagnosis not present

## 2016-10-08 DIAGNOSIS — F1011 Alcohol abuse, in remission: Secondary | ICD-10-CM | POA: Diagnosis not present

## 2016-10-08 DIAGNOSIS — Z608 Other problems related to social environment: Secondary | ICD-10-CM | POA: Diagnosis not present

## 2016-10-08 DIAGNOSIS — R0681 Apnea, not elsewhere classified: Secondary | ICD-10-CM | POA: Diagnosis not present

## 2016-10-08 DIAGNOSIS — R0789 Other chest pain: Secondary | ICD-10-CM | POA: Diagnosis not present

## 2016-10-08 DIAGNOSIS — R52 Pain, unspecified: Secondary | ICD-10-CM | POA: Diagnosis not present

## 2016-10-08 DIAGNOSIS — F209 Schizophrenia, unspecified: Secondary | ICD-10-CM | POA: Diagnosis not present

## 2016-10-22 DIAGNOSIS — M1711 Unilateral primary osteoarthritis, right knee: Secondary | ICD-10-CM | POA: Diagnosis not present

## 2016-10-22 DIAGNOSIS — M479 Spondylosis, unspecified: Secondary | ICD-10-CM | POA: Diagnosis not present

## 2016-10-22 DIAGNOSIS — S8991XA Unspecified injury of right lower leg, initial encounter: Secondary | ICD-10-CM | POA: Diagnosis not present

## 2016-10-22 DIAGNOSIS — Z79899 Other long term (current) drug therapy: Secondary | ICD-10-CM | POA: Diagnosis not present

## 2016-10-22 DIAGNOSIS — M1712 Unilateral primary osteoarthritis, left knee: Secondary | ICD-10-CM | POA: Diagnosis not present

## 2016-10-22 DIAGNOSIS — G894 Chronic pain syndrome: Secondary | ICD-10-CM | POA: Diagnosis not present

## 2016-10-31 DIAGNOSIS — I10 Essential (primary) hypertension: Secondary | ICD-10-CM | POA: Diagnosis not present

## 2016-10-31 DIAGNOSIS — R030 Elevated blood-pressure reading, without diagnosis of hypertension: Secondary | ICD-10-CM | POA: Diagnosis not present

## 2016-10-31 DIAGNOSIS — F319 Bipolar disorder, unspecified: Secondary | ICD-10-CM | POA: Diagnosis not present

## 2016-10-31 DIAGNOSIS — F333 Major depressive disorder, recurrent, severe with psychotic symptoms: Secondary | ICD-10-CM | POA: Diagnosis not present

## 2016-10-31 DIAGNOSIS — M549 Dorsalgia, unspecified: Secondary | ICD-10-CM | POA: Diagnosis not present

## 2016-10-31 DIAGNOSIS — Z888 Allergy status to other drugs, medicaments and biological substances status: Secondary | ICD-10-CM | POA: Diagnosis not present

## 2016-10-31 DIAGNOSIS — Z79899 Other long term (current) drug therapy: Secondary | ICD-10-CM | POA: Diagnosis not present

## 2016-11-02 DIAGNOSIS — K219 Gastro-esophageal reflux disease without esophagitis: Secondary | ICD-10-CM | POA: Diagnosis not present

## 2016-11-02 DIAGNOSIS — I10 Essential (primary) hypertension: Secondary | ICD-10-CM | POA: Diagnosis not present

## 2016-11-02 DIAGNOSIS — F329 Major depressive disorder, single episode, unspecified: Secondary | ICD-10-CM | POA: Diagnosis not present

## 2016-11-02 DIAGNOSIS — Z79899 Other long term (current) drug therapy: Secondary | ICD-10-CM | POA: Diagnosis not present

## 2016-11-02 DIAGNOSIS — Z888 Allergy status to other drugs, medicaments and biological substances status: Secondary | ICD-10-CM | POA: Diagnosis not present

## 2016-11-02 DIAGNOSIS — F418 Other specified anxiety disorders: Secondary | ICD-10-CM | POA: Diagnosis not present

## 2016-11-02 DIAGNOSIS — F419 Anxiety disorder, unspecified: Secondary | ICD-10-CM | POA: Diagnosis not present

## 2016-11-02 DIAGNOSIS — E669 Obesity, unspecified: Secondary | ICD-10-CM | POA: Diagnosis not present

## 2016-11-02 DIAGNOSIS — R030 Elevated blood-pressure reading, without diagnosis of hypertension: Secondary | ICD-10-CM | POA: Diagnosis not present

## 2016-11-03 DIAGNOSIS — G4730 Sleep apnea, unspecified: Secondary | ICD-10-CM | POA: Diagnosis not present

## 2016-11-03 DIAGNOSIS — F323 Major depressive disorder, single episode, severe with psychotic features: Secondary | ICD-10-CM | POA: Diagnosis not present

## 2016-11-03 DIAGNOSIS — R45851 Suicidal ideations: Secondary | ICD-10-CM | POA: Diagnosis present

## 2016-11-03 DIAGNOSIS — K219 Gastro-esophageal reflux disease without esophagitis: Secondary | ICD-10-CM | POA: Diagnosis present

## 2016-11-03 DIAGNOSIS — R2681 Unsteadiness on feet: Secondary | ICD-10-CM | POA: Diagnosis present

## 2016-11-03 DIAGNOSIS — G4733 Obstructive sleep apnea (adult) (pediatric): Secondary | ICD-10-CM | POA: Diagnosis present

## 2016-11-03 DIAGNOSIS — K5730 Diverticulosis of large intestine without perforation or abscess without bleeding: Secondary | ICD-10-CM | POA: Diagnosis not present

## 2016-11-03 DIAGNOSIS — F312 Bipolar disorder, current episode manic severe with psychotic features: Secondary | ICD-10-CM | POA: Diagnosis present

## 2016-11-03 DIAGNOSIS — R109 Unspecified abdominal pain: Secondary | ICD-10-CM | POA: Diagnosis not present

## 2016-11-03 DIAGNOSIS — Z9181 History of falling: Secondary | ICD-10-CM | POA: Diagnosis not present

## 2016-11-03 DIAGNOSIS — F419 Anxiety disorder, unspecified: Secondary | ICD-10-CM | POA: Diagnosis not present

## 2016-11-03 DIAGNOSIS — R0789 Other chest pain: Secondary | ICD-10-CM | POA: Diagnosis not present

## 2016-11-03 DIAGNOSIS — R4182 Altered mental status, unspecified: Secondary | ICD-10-CM | POA: Diagnosis not present

## 2016-11-03 DIAGNOSIS — Z79899 Other long term (current) drug therapy: Secondary | ICD-10-CM | POA: Diagnosis not present

## 2016-11-03 DIAGNOSIS — F329 Major depressive disorder, single episode, unspecified: Secondary | ICD-10-CM | POA: Diagnosis not present

## 2016-11-03 DIAGNOSIS — K429 Umbilical hernia without obstruction or gangrene: Secondary | ICD-10-CM | POA: Diagnosis not present

## 2016-11-03 DIAGNOSIS — Z6841 Body Mass Index (BMI) 40.0 and over, adult: Secondary | ICD-10-CM | POA: Diagnosis not present

## 2016-11-03 DIAGNOSIS — F259 Schizoaffective disorder, unspecified: Secondary | ICD-10-CM | POA: Diagnosis not present

## 2016-11-03 DIAGNOSIS — G894 Chronic pain syndrome: Secondary | ICD-10-CM | POA: Diagnosis present

## 2016-11-03 DIAGNOSIS — I10 Essential (primary) hypertension: Secondary | ICD-10-CM | POA: Diagnosis present

## 2016-11-03 DIAGNOSIS — Z9114 Patient's other noncompliance with medication regimen: Secondary | ICD-10-CM | POA: Diagnosis not present

## 2016-11-03 DIAGNOSIS — E669 Obesity, unspecified: Secondary | ICD-10-CM | POA: Diagnosis not present

## 2016-11-03 DIAGNOSIS — M549 Dorsalgia, unspecified: Secondary | ICD-10-CM | POA: Diagnosis present

## 2016-11-03 DIAGNOSIS — M25512 Pain in left shoulder: Secondary | ICD-10-CM | POA: Diagnosis present

## 2016-11-03 DIAGNOSIS — M25561 Pain in right knee: Secondary | ICD-10-CM | POA: Diagnosis present

## 2016-11-03 DIAGNOSIS — F209 Schizophrenia, unspecified: Secondary | ICD-10-CM | POA: Diagnosis not present

## 2016-11-03 DIAGNOSIS — Z0389 Encounter for observation for other suspected diseases and conditions ruled out: Secondary | ICD-10-CM | POA: Diagnosis not present

## 2016-11-03 DIAGNOSIS — M25511 Pain in right shoulder: Secondary | ICD-10-CM | POA: Diagnosis present

## 2016-11-03 DIAGNOSIS — R079 Chest pain, unspecified: Secondary | ICD-10-CM | POA: Diagnosis not present

## 2016-11-03 DIAGNOSIS — Z915 Personal history of self-harm: Secondary | ICD-10-CM | POA: Diagnosis not present

## 2016-11-03 DIAGNOSIS — R1084 Generalized abdominal pain: Secondary | ICD-10-CM | POA: Diagnosis not present

## 2016-11-03 DIAGNOSIS — M25562 Pain in left knee: Secondary | ICD-10-CM | POA: Diagnosis present

## 2016-11-13 DIAGNOSIS — Z0389 Encounter for observation for other suspected diseases and conditions ruled out: Secondary | ICD-10-CM | POA: Diagnosis not present

## 2016-11-14 DIAGNOSIS — F209 Schizophrenia, unspecified: Secondary | ICD-10-CM | POA: Diagnosis not present

## 2016-11-14 DIAGNOSIS — R109 Unspecified abdominal pain: Secondary | ICD-10-CM | POA: Diagnosis not present

## 2016-11-14 DIAGNOSIS — F259 Schizoaffective disorder, unspecified: Secondary | ICD-10-CM | POA: Diagnosis not present

## 2016-11-14 DIAGNOSIS — K5730 Diverticulosis of large intestine without perforation or abscess without bleeding: Secondary | ICD-10-CM | POA: Diagnosis not present

## 2016-11-14 DIAGNOSIS — R1084 Generalized abdominal pain: Secondary | ICD-10-CM | POA: Diagnosis not present

## 2016-11-14 DIAGNOSIS — R0789 Other chest pain: Secondary | ICD-10-CM | POA: Diagnosis not present

## 2016-11-14 DIAGNOSIS — G4730 Sleep apnea, unspecified: Secondary | ICD-10-CM | POA: Diagnosis not present

## 2016-11-14 DIAGNOSIS — I10 Essential (primary) hypertension: Secondary | ICD-10-CM | POA: Diagnosis not present

## 2016-11-14 DIAGNOSIS — R079 Chest pain, unspecified: Secondary | ICD-10-CM | POA: Diagnosis not present

## 2017-01-21 DIAGNOSIS — Z79899 Other long term (current) drug therapy: Secondary | ICD-10-CM | POA: Diagnosis not present

## 2017-01-21 DIAGNOSIS — R0789 Other chest pain: Secondary | ICD-10-CM | POA: Diagnosis not present

## 2017-01-21 DIAGNOSIS — Z9049 Acquired absence of other specified parts of digestive tract: Secondary | ICD-10-CM | POA: Diagnosis not present

## 2017-01-21 DIAGNOSIS — R7989 Other specified abnormal findings of blood chemistry: Secondary | ICD-10-CM | POA: Diagnosis not present

## 2017-01-21 DIAGNOSIS — M791 Myalgia: Secondary | ICD-10-CM | POA: Diagnosis not present

## 2017-01-21 DIAGNOSIS — R079 Chest pain, unspecified: Secondary | ICD-10-CM | POA: Diagnosis not present

## 2017-01-21 DIAGNOSIS — Z90710 Acquired absence of both cervix and uterus: Secondary | ICD-10-CM | POA: Diagnosis not present

## 2017-01-21 DIAGNOSIS — I10 Essential (primary) hypertension: Secondary | ICD-10-CM | POA: Diagnosis not present

## 2017-02-18 ENCOUNTER — Encounter (HOSPITAL_COMMUNITY): Payer: Self-pay | Admitting: Emergency Medicine

## 2017-02-18 ENCOUNTER — Ambulatory Visit (HOSPITAL_COMMUNITY)
Admission: EM | Admit: 2017-02-18 | Discharge: 2017-02-18 | Disposition: A | Discharge status: Home / Self Care | Payer: Medicaid Other | Attending: Internal Medicine | Admitting: Internal Medicine

## 2017-02-18 DIAGNOSIS — J0140 Acute pansinusitis, unspecified: Secondary | ICD-10-CM

## 2017-02-18 DIAGNOSIS — Z131 Encounter for screening for diabetes mellitus: Secondary | ICD-10-CM

## 2017-02-18 HISTORY — DX: Other cervical disc degeneration, unspecified cervical region: M50.30

## 2017-02-18 HISTORY — DX: Essential (primary) hypertension: I10

## 2017-02-18 LAB — GLUCOSE, CAPILLARY: GLUCOSE-CAPILLARY: 106 mg/dL — AB (ref 65–99)

## 2017-02-18 MED ORDER — DICLOFENAC SODIUM 1 % TD GEL: 2.0000 g | Freq: Four times a day (QID) | TRANSDERMAL | 0 refills | Status: AC

## 2017-02-18 MED ORDER — RANITIDINE HCL 150 MG PO CAPS: 150.0000 mg | ORAL_CAPSULE | Freq: Every day | ORAL | 0 refills | Status: AC

## 2017-02-18 MED ORDER — NAPROXEN 500 MG PO TABS: 500.0000 mg | ORAL_TABLET | Freq: Two times a day (BID) | ORAL | 0 refills | Status: AC

## 2017-02-18 MED ORDER — AMOXICILLIN-POT CLAVULANATE 875-125 MG PO TABS: 1.0000 | ORAL_TABLET | Freq: Two times a day (BID) | ORAL | 0 refills | Status: AC

## 2017-02-18 NOTE — ED Provider Notes (Signed)
[facility]    CSN: [id number] Arrival date & time: 02/18/17  1329     History   Chief Complaint Chief Complaint  Patient presents with  . Generalized Body Aches  . Cough    HPI Rachel Oconnell is a 51 y.o. female.   51 year old female comes in for 2 week history of productive cough. Patient states she is constantly spitting of phlegm, and not necessarily associated with coughing. Denies nasal congestion, ear pain, eye pain, sore throat. Subjective fever with chills. Has had some abdominal pain around a bite wound on the abdomen, and panty line area. Has had some nausea with the symptoms. Denies vomiting, diarrhea. Is a some day smoker, recently increased smoking amounts. Has tried vinegar, salt gurgle without relief. Has had some substernal chest pain that is intermittent that is fluttering in sensation, denies burning. Denies shortness of breath.   Has noticed some blood on her undergarments and noticed wound on the stomach. Has also has rash under the breast that is itching and burning. Has also noticed burning sensation on her hands and feet, stating that they are red and painful. Denies history of diabetes.       Past Medical History:  Diagnosis Date  . DDD (degenerative disc disease), cervical   . Hypertension     There are no active problems to display for this patient.   Past Surgical History:  Procedure Laterality Date  . JOINT REPLACEMENT      OB History    No data available       Home Medications    Prior to Admission medications   Medication Sig Start Date End Date Taking? Authorizing Provider  ALPRAZolam Prudy Feeler) 1 MG tablet Take 1 mg by mouth 3 (three) times daily as needed for anxiety.   Yes [provider]  Hydrocodone-Acetaminophen (VICODIN) 5-300 MG TABS Take 1 tablet by mouth.   Yes [provider]  potassium chloride (K-DUR) 10 MEQ tablet Take 20 mEq by mouth daily.   Yes [provider]  VERAPAMIL HCL PO  Take 240 mg by mouth 2 (two) times daily.   Yes [provider]  amoxicillin-clavulanate (AUGMENTIN) 875-125 MG tablet Take 1 tablet by mouth every 12 (twelve) hours. 02/18/17   Cathie Hoops, Surah Pelley V, PA-C  diclofenac sodium (VOLTAREN) 1 % GEL Apply 2 g topically 4 (four) times daily. 02/18/17   Cathie Hoops, Coralyn Roselli V, PA-C  naproxen (NAPROSYN) 500 MG tablet Take 1 tablet (500 mg total) by mouth 2 (two) times daily. 02/18/17 02/28/17  Belinda Fisher, PA-C  ranitidine (ZANTAC) 150 MG capsule Take 1 capsule (150 mg total) by mouth daily. 02/18/17   Belinda Fisher, PA-C    Family History History reviewed. No pertinent family history.  Social History Social History  Substance Use Topics  . Smoking status: Current Some Day Smoker  . Smokeless tobacco: Never Used  . Alcohol use No     Allergies   Patient has no known allergies.   Review of Systems Review of Systems  Reason unable to perform ROS: See HPI as above.     Physical Exam Triage Vital Signs ED Triage Vitals  Enc Vitals Group     BP --      Pulse Rate 02/18/17 1427 86     Resp --      Temp 02/18/17 1427 99.1 F (37.3 C)     Temp Source 02/18/17 1427 Oral     SpO2 02/18/17 1427 100 %  Weight --      Height --      Head Circumference --      Peak Flow --      Pain Score 02/18/17 1428 10     Pain Loc --      Pain Edu? --      Excl. in GC? --    No data found.   Updated Vital Signs Pulse 86   Temp 99.1 F (37.3 C) (Oral)   SpO2 100%   Physical Exam  Constitutional: She is oriented to person, place, and time. She appears well-developed and well-nourished. No distress.  Patient continues to spit on exam, no coughing.  HENT:  Head: Normocephalic and atraumatic.  Right Ear: Tympanic membrane, external ear and ear canal normal. Tympanic membrane is not erythematous and not bulging.  Left Ear: Tympanic membrane, external ear and ear canal normal. Tympanic membrane is not erythematous and not bulging.  Nose: Mucosal edema and rhinorrhea  present. Right sinus exhibits maxillary sinus tenderness and frontal sinus tenderness. Left sinus exhibits maxillary sinus tenderness and frontal sinus tenderness.  Mouth/Throat: Uvula is midline, oropharynx is clear and moist and mucous membranes are normal.  Eyes: Pupils are equal, round, and reactive to light. Conjunctivae are normal.  Neck: Normal range of motion. Neck supple.  Cardiovascular: Normal rate, regular rhythm and normal heart sounds.  Exam reveals no gallop and no friction rub.   No murmur heard. Pulmonary/Chest: Effort normal and breath sounds normal. She has no decreased breath sounds. She has no wheezes. She has no rhonchi. She has no rales.  Abdominal: Soft. Bowel sounds are normal. She exhibits no mass. There is no tenderness. There is no rebound and no guarding.  Exam limited due to body habitus. Small wound on right lower abdomen without spreading erythema, increased warmth, drainage. Mild epigastric tenderness on palpation. No guarding, rebound.   Lymphadenopathy:    She has no cervical adenopathy.  Neurological: She is alert and oriented to person, place, and time.  Skin: Skin is warm and dry.  No rashes, erythema, increased warmth, swelling.   Psychiatric: She has a normal mood and affect. Her behavior is normal. Judgment normal.     UC Treatments / Results  Labs (all labs ordered are listed, but only abnormal results are displayed) Labs Reviewed  GLUCOSE, CAPILLARY - Abnormal; Notable for the following:       Result Value   Glucose-Capillary 106 (*)    All other components within normal limits    EKG  EKG Interpretation None       Radiology No results found.  Procedures Procedures (including critical care time)  Medications Ordered in UC Medications - No data to display   Initial Impression / Assessment and Plan / UC Course  I have reviewed the triage vital signs and the nursing notes.  Pertinent labs & imaging results that were available  during my care of the patient were reviewed by me and considered in my medical decision making (see chart for details).    Given exam, will treat for sinusitis. Start Augmentin as directed. Discussed with patient symptoms could also be due to acid reflux, to take Zantac as directed. Inflammation of food choices for acid reflux given. Push fluids. Patient to follow up with PCP for further evaluation and treatment of burning sensation of the extremities. Attached information for podiatry for foot care. Return precautions given.  Final Clinical Impressions(s) / UC Diagnoses   Final diagnoses:  Acute non-recurrent pansinusitis  New Prescriptions Discharge Medication List as of 02/18/2017  4:49 PM    START taking these medications   Details  amoxicillin-clavulanate (AUGMENTIN) 875-125 MG tablet Take 1 tablet by mouth every 12 (twelve) hours., Starting Mon 02/18/2017, Normal    diclofenac sodium (VOLTAREN) 1 % GEL Apply 2 g topically 4 (four) times daily., Starting Mon 02/18/2017, Normal    naproxen (NAPROSYN) 500 MG tablet Take 1 tablet (500 mg total) by mouth 2 (two) times daily., Starting Mon 02/18/2017, Until Thu 02/28/2017, Normal    ranitidine (ZANTAC) 150 MG capsule Take 1 capsule (150 mg total) by mouth daily., Starting Mon 02/18/2017, Normal          Cathie Hoops, Charron Coultas V, PA-C 02/18/17 2221

## 2017-02-18 NOTE — ED Notes (Signed)
Patient requesting pain medication. Discussed in detail prescriptions and what they are each for. Patient verbalized understanding. Encouraged patient to follow up with PCP.

## 2017-02-18 NOTE — Discharge Instructions (Addendum)
Exam consistent with sinusitis, take augmentin as directed. Symptoms could also be due to acid reflux, take zantac as directed. I have attached some information about food choices for acid reflux symptoms that may help. Keep hydrated, you urine should be clear to pale yellow in color.   Your hands/feet burning sensation will need further workup, please follow up with PCP for further evaluation and treatment needed. I have attached podiatry information for you to follow up as well.

## 2017-02-18 NOTE — ED Triage Notes (Signed)
Pt reports generalized body aches, a productive cough, burning in her hands and feet and bites on her hands, feet and legs.

## 2017-03-28 DIAGNOSIS — M479 Spondylosis, unspecified: Secondary | ICD-10-CM | POA: Diagnosis not present

## 2017-03-28 DIAGNOSIS — M25561 Pain in right knee: Secondary | ICD-10-CM | POA: Diagnosis not present

## 2017-03-28 DIAGNOSIS — M25571 Pain in right ankle and joints of right foot: Secondary | ICD-10-CM | POA: Diagnosis not present

## 2017-03-28 DIAGNOSIS — M1712 Unilateral primary osteoarthritis, left knee: Secondary | ICD-10-CM | POA: Diagnosis not present

## 2017-03-28 DIAGNOSIS — M25572 Pain in left ankle and joints of left foot: Secondary | ICD-10-CM | POA: Diagnosis not present

## 2017-03-28 DIAGNOSIS — M25562 Pain in left knee: Secondary | ICD-10-CM | POA: Diagnosis not present

## 2017-03-28 DIAGNOSIS — M545 Low back pain: Secondary | ICD-10-CM | POA: Diagnosis not present

## 2017-03-28 DIAGNOSIS — M1711 Unilateral primary osteoarthritis, right knee: Secondary | ICD-10-CM | POA: Diagnosis not present

## 2017-04-14 DIAGNOSIS — R0789 Other chest pain: Secondary | ICD-10-CM | POA: Diagnosis not present

## 2017-04-14 DIAGNOSIS — I10 Essential (primary) hypertension: Secondary | ICD-10-CM | POA: Diagnosis not present

## 2017-04-14 DIAGNOSIS — R05 Cough: Secondary | ICD-10-CM | POA: Diagnosis not present

## 2017-04-14 DIAGNOSIS — J40 Bronchitis, not specified as acute or chronic: Secondary | ICD-10-CM | POA: Diagnosis not present

## 2017-04-14 DIAGNOSIS — F17200 Nicotine dependence, unspecified, uncomplicated: Secondary | ICD-10-CM | POA: Diagnosis not present

## 2017-04-14 DIAGNOSIS — R079 Chest pain, unspecified: Secondary | ICD-10-CM | POA: Diagnosis not present

## 2017-04-15 DIAGNOSIS — J40 Bronchitis, not specified as acute or chronic: Secondary | ICD-10-CM | POA: Diagnosis not present

## 2017-04-15 DIAGNOSIS — R0602 Shortness of breath: Secondary | ICD-10-CM | POA: Diagnosis not present

## 2017-04-15 DIAGNOSIS — R7309 Other abnormal glucose: Secondary | ICD-10-CM | POA: Diagnosis not present

## 2017-04-15 DIAGNOSIS — J209 Acute bronchitis, unspecified: Secondary | ICD-10-CM | POA: Diagnosis not present

## 2017-04-15 DIAGNOSIS — R079 Chest pain, unspecified: Secondary | ICD-10-CM | POA: Diagnosis not present

## 2017-04-15 DIAGNOSIS — R0789 Other chest pain: Secondary | ICD-10-CM | POA: Diagnosis not present

## 2017-04-15 DIAGNOSIS — F17200 Nicotine dependence, unspecified, uncomplicated: Secondary | ICD-10-CM | POA: Diagnosis not present

## 2017-04-15 DIAGNOSIS — I10 Essential (primary) hypertension: Secondary | ICD-10-CM | POA: Diagnosis not present

## 2017-04-15 DIAGNOSIS — F17210 Nicotine dependence, cigarettes, uncomplicated: Secondary | ICD-10-CM | POA: Diagnosis not present

## 2017-04-15 DIAGNOSIS — G8929 Other chronic pain: Secondary | ICD-10-CM | POA: Diagnosis not present

## 2017-04-15 DIAGNOSIS — R05 Cough: Secondary | ICD-10-CM | POA: Diagnosis not present

## 2017-04-26 DIAGNOSIS — J209 Acute bronchitis, unspecified: Secondary | ICD-10-CM | POA: Diagnosis not present

## 2017-04-26 DIAGNOSIS — J40 Bronchitis, not specified as acute or chronic: Secondary | ICD-10-CM | POA: Diagnosis not present

## 2017-04-26 DIAGNOSIS — F329 Major depressive disorder, single episode, unspecified: Secondary | ICD-10-CM | POA: Diagnosis not present

## 2017-04-26 DIAGNOSIS — M25571 Pain in right ankle and joints of right foot: Secondary | ICD-10-CM | POA: Diagnosis not present

## 2017-04-26 DIAGNOSIS — I10 Essential (primary) hypertension: Secondary | ICD-10-CM | POA: Diagnosis not present

## 2017-04-26 DIAGNOSIS — M7731 Calcaneal spur, right foot: Secondary | ICD-10-CM | POA: Diagnosis not present

## 2017-04-26 DIAGNOSIS — G8929 Other chronic pain: Secondary | ICD-10-CM | POA: Diagnosis not present

## 2017-04-26 DIAGNOSIS — M19071 Primary osteoarthritis, right ankle and foot: Secondary | ICD-10-CM | POA: Diagnosis not present

## 2017-04-26 DIAGNOSIS — Z888 Allergy status to other drugs, medicaments and biological substances status: Secondary | ICD-10-CM | POA: Diagnosis not present

## 2017-04-27 DIAGNOSIS — I10 Essential (primary) hypertension: Secondary | ICD-10-CM | POA: Diagnosis not present

## 2017-04-27 DIAGNOSIS — Z888 Allergy status to other drugs, medicaments and biological substances status: Secondary | ICD-10-CM | POA: Diagnosis not present

## 2017-04-27 DIAGNOSIS — F319 Bipolar disorder, unspecified: Secondary | ICD-10-CM | POA: Diagnosis not present

## 2017-04-27 DIAGNOSIS — G8929 Other chronic pain: Secondary | ICD-10-CM | POA: Diagnosis not present

## 2017-04-27 DIAGNOSIS — M545 Low back pain: Secondary | ICD-10-CM | POA: Diagnosis not present

## 2017-05-03 DIAGNOSIS — L03032 Cellulitis of left toe: Secondary | ICD-10-CM | POA: Diagnosis not present

## 2017-05-03 DIAGNOSIS — F319 Bipolar disorder, unspecified: Secondary | ICD-10-CM | POA: Diagnosis not present

## 2017-05-03 DIAGNOSIS — T69022A Immersion foot, left foot, initial encounter: Secondary | ICD-10-CM | POA: Diagnosis not present

## 2017-05-03 DIAGNOSIS — T69021A Immersion foot, right foot, initial encounter: Secondary | ICD-10-CM | POA: Diagnosis not present

## 2017-05-03 DIAGNOSIS — Z8249 Family history of ischemic heart disease and other diseases of the circulatory system: Secondary | ICD-10-CM | POA: Diagnosis not present

## 2017-05-03 DIAGNOSIS — E669 Obesity, unspecified: Secondary | ICD-10-CM | POA: Diagnosis not present

## 2017-05-03 DIAGNOSIS — L03031 Cellulitis of right toe: Secondary | ICD-10-CM | POA: Diagnosis not present

## 2017-05-03 DIAGNOSIS — I10 Essential (primary) hypertension: Secondary | ICD-10-CM | POA: Diagnosis not present

## 2017-05-09 DIAGNOSIS — Z9049 Acquired absence of other specified parts of digestive tract: Secondary | ICD-10-CM | POA: Diagnosis not present

## 2017-05-09 DIAGNOSIS — Z90710 Acquired absence of both cervix and uterus: Secondary | ICD-10-CM | POA: Diagnosis not present

## 2017-05-09 DIAGNOSIS — I10 Essential (primary) hypertension: Secondary | ICD-10-CM | POA: Diagnosis not present

## 2017-05-09 DIAGNOSIS — Z886 Allergy status to analgesic agent status: Secondary | ICD-10-CM | POA: Diagnosis not present

## 2017-05-09 DIAGNOSIS — F329 Major depressive disorder, single episode, unspecified: Secondary | ICD-10-CM | POA: Diagnosis not present

## 2017-05-09 DIAGNOSIS — E669 Obesity, unspecified: Secondary | ICD-10-CM | POA: Diagnosis not present

## 2017-05-09 DIAGNOSIS — M79672 Pain in left foot: Secondary | ICD-10-CM | POA: Diagnosis not present

## 2017-05-09 DIAGNOSIS — Z888 Allergy status to other drugs, medicaments and biological substances status: Secondary | ICD-10-CM | POA: Diagnosis not present

## 2017-05-09 DIAGNOSIS — F332 Major depressive disorder, recurrent severe without psychotic features: Secondary | ICD-10-CM | POA: Diagnosis not present

## 2017-05-09 DIAGNOSIS — M79671 Pain in right foot: Secondary | ICD-10-CM | POA: Diagnosis not present

## 2017-05-10 DIAGNOSIS — I10 Essential (primary) hypertension: Secondary | ICD-10-CM | POA: Diagnosis not present

## 2017-05-10 DIAGNOSIS — M1990 Unspecified osteoarthritis, unspecified site: Secondary | ICD-10-CM | POA: Diagnosis not present

## 2017-05-10 DIAGNOSIS — B351 Tinea unguium: Secondary | ICD-10-CM | POA: Diagnosis not present

## 2017-05-10 DIAGNOSIS — M5136 Other intervertebral disc degeneration, lumbar region: Secondary | ICD-10-CM | POA: Diagnosis not present

## 2017-05-10 DIAGNOSIS — G629 Polyneuropathy, unspecified: Secondary | ICD-10-CM | POA: Diagnosis not present

## 2017-05-10 DIAGNOSIS — L03115 Cellulitis of right lower limb: Secondary | ICD-10-CM | POA: Diagnosis not present

## 2017-05-10 DIAGNOSIS — K219 Gastro-esophageal reflux disease without esophagitis: Secondary | ICD-10-CM | POA: Diagnosis not present

## 2017-05-10 DIAGNOSIS — L03116 Cellulitis of left lower limb: Secondary | ICD-10-CM | POA: Diagnosis not present

## 2017-05-12 DIAGNOSIS — F331 Major depressive disorder, recurrent, moderate: Secondary | ICD-10-CM | POA: Diagnosis not present

## 2017-05-16 DIAGNOSIS — B351 Tinea unguium: Secondary | ICD-10-CM | POA: Diagnosis not present

## 2017-05-16 DIAGNOSIS — I10 Essential (primary) hypertension: Secondary | ICD-10-CM | POA: Diagnosis not present

## 2017-05-21 DIAGNOSIS — M5489 Other dorsalgia: Secondary | ICD-10-CM | POA: Diagnosis not present

## 2017-05-21 DIAGNOSIS — G8929 Other chronic pain: Secondary | ICD-10-CM | POA: Diagnosis not present

## 2017-05-21 DIAGNOSIS — L03115 Cellulitis of right lower limb: Secondary | ICD-10-CM | POA: Diagnosis not present

## 2017-05-21 DIAGNOSIS — L03116 Cellulitis of left lower limb: Secondary | ICD-10-CM | POA: Diagnosis not present

## 2017-05-27 DIAGNOSIS — G8929 Other chronic pain: Secondary | ICD-10-CM | POA: Diagnosis not present

## 2017-05-27 DIAGNOSIS — F329 Major depressive disorder, single episode, unspecified: Secondary | ICD-10-CM | POA: Diagnosis not present

## 2017-05-27 DIAGNOSIS — Z888 Allergy status to other drugs, medicaments and biological substances status: Secondary | ICD-10-CM | POA: Diagnosis not present

## 2017-05-28 DIAGNOSIS — F332 Major depressive disorder, recurrent severe without psychotic features: Secondary | ICD-10-CM | POA: Diagnosis not present

## 2017-05-29 DIAGNOSIS — F329 Major depressive disorder, single episode, unspecified: Secondary | ICD-10-CM | POA: Diagnosis not present

## 2017-05-29 DIAGNOSIS — I10 Essential (primary) hypertension: Secondary | ICD-10-CM | POA: Diagnosis not present

## 2017-05-29 DIAGNOSIS — F419 Anxiety disorder, unspecified: Secondary | ICD-10-CM | POA: Diagnosis not present

## 2017-05-29 DIAGNOSIS — G8929 Other chronic pain: Secondary | ICD-10-CM | POA: Diagnosis not present

## 2017-05-29 DIAGNOSIS — E669 Obesity, unspecified: Secondary | ICD-10-CM | POA: Diagnosis not present

## 2017-05-29 DIAGNOSIS — F332 Major depressive disorder, recurrent severe without psychotic features: Secondary | ICD-10-CM | POA: Diagnosis not present

## 2017-05-30 DIAGNOSIS — R45851 Suicidal ideations: Secondary | ICD-10-CM | POA: Diagnosis not present

## 2017-05-30 DIAGNOSIS — F329 Major depressive disorder, single episode, unspecified: Secondary | ICD-10-CM | POA: Diagnosis not present

## 2017-05-30 DIAGNOSIS — I10 Essential (primary) hypertension: Secondary | ICD-10-CM | POA: Diagnosis not present

## 2017-05-30 DIAGNOSIS — B351 Tinea unguium: Secondary | ICD-10-CM | POA: Diagnosis not present

## 2017-06-01 DIAGNOSIS — F332 Major depressive disorder, recurrent severe without psychotic features: Secondary | ICD-10-CM | POA: Diagnosis not present

## 2017-06-01 DIAGNOSIS — I10 Essential (primary) hypertension: Secondary | ICD-10-CM | POA: Diagnosis not present

## 2017-06-01 DIAGNOSIS — B351 Tinea unguium: Secondary | ICD-10-CM | POA: Diagnosis not present

## 2017-06-01 DIAGNOSIS — S90211A Contusion of right great toe with damage to nail, initial encounter: Secondary | ICD-10-CM | POA: Diagnosis not present

## 2017-06-02 DIAGNOSIS — F332 Major depressive disorder, recurrent severe without psychotic features: Secondary | ICD-10-CM | POA: Diagnosis not present

## 2017-06-02 DIAGNOSIS — I10 Essential (primary) hypertension: Secondary | ICD-10-CM | POA: Diagnosis not present

## 2017-06-03 DIAGNOSIS — F332 Major depressive disorder, recurrent severe without psychotic features: Secondary | ICD-10-CM | POA: Diagnosis not present

## 2017-06-03 DIAGNOSIS — I10 Essential (primary) hypertension: Secondary | ICD-10-CM | POA: Diagnosis not present

## 2017-06-04 DIAGNOSIS — F332 Major depressive disorder, recurrent severe without psychotic features: Secondary | ICD-10-CM | POA: Diagnosis not present

## 2017-06-04 DIAGNOSIS — I10 Essential (primary) hypertension: Secondary | ICD-10-CM | POA: Diagnosis not present

## 2017-06-05 DIAGNOSIS — I10 Essential (primary) hypertension: Secondary | ICD-10-CM | POA: Diagnosis not present

## 2017-06-05 DIAGNOSIS — F332 Major depressive disorder, recurrent severe without psychotic features: Secondary | ICD-10-CM | POA: Diagnosis not present

## 2017-06-06 DIAGNOSIS — F332 Major depressive disorder, recurrent severe without psychotic features: Secondary | ICD-10-CM | POA: Diagnosis not present

## 2017-06-07 DIAGNOSIS — F332 Major depressive disorder, recurrent severe without psychotic features: Secondary | ICD-10-CM | POA: Diagnosis not present

## 2017-06-08 DIAGNOSIS — F332 Major depressive disorder, recurrent severe without psychotic features: Secondary | ICD-10-CM | POA: Diagnosis not present

## 2017-06-08 DIAGNOSIS — S90211A Contusion of right great toe with damage to nail, initial encounter: Secondary | ICD-10-CM | POA: Diagnosis not present

## 2017-06-08 DIAGNOSIS — R072 Precordial pain: Secondary | ICD-10-CM | POA: Diagnosis not present

## 2017-06-09 DIAGNOSIS — F332 Major depressive disorder, recurrent severe without psychotic features: Secondary | ICD-10-CM | POA: Diagnosis not present

## 2017-06-10 DIAGNOSIS — F332 Major depressive disorder, recurrent severe without psychotic features: Secondary | ICD-10-CM | POA: Diagnosis not present

## 2017-06-11 DIAGNOSIS — F332 Major depressive disorder, recurrent severe without psychotic features: Secondary | ICD-10-CM | POA: Diagnosis not present

## 2017-06-12 DIAGNOSIS — F332 Major depressive disorder, recurrent severe without psychotic features: Secondary | ICD-10-CM | POA: Diagnosis not present

## 2017-06-13 DIAGNOSIS — S90211A Contusion of right great toe with damage to nail, initial encounter: Secondary | ICD-10-CM | POA: Diagnosis not present

## 2017-06-13 DIAGNOSIS — F332 Major depressive disorder, recurrent severe without psychotic features: Secondary | ICD-10-CM | POA: Diagnosis not present

## 2017-06-13 DIAGNOSIS — B351 Tinea unguium: Secondary | ICD-10-CM | POA: Diagnosis not present

## 2017-06-14 DIAGNOSIS — F332 Major depressive disorder, recurrent severe without psychotic features: Secondary | ICD-10-CM | POA: Diagnosis not present

## 2017-06-15 DIAGNOSIS — F332 Major depressive disorder, recurrent severe without psychotic features: Secondary | ICD-10-CM | POA: Diagnosis not present

## 2017-06-16 DIAGNOSIS — F332 Major depressive disorder, recurrent severe without psychotic features: Secondary | ICD-10-CM | POA: Diagnosis not present

## 2017-06-16 DIAGNOSIS — I10 Essential (primary) hypertension: Secondary | ICD-10-CM | POA: Diagnosis not present

## 2017-06-17 DIAGNOSIS — F332 Major depressive disorder, recurrent severe without psychotic features: Secondary | ICD-10-CM | POA: Diagnosis not present

## 2017-06-17 DIAGNOSIS — I10 Essential (primary) hypertension: Secondary | ICD-10-CM | POA: Diagnosis not present

## 2017-06-18 DIAGNOSIS — F332 Major depressive disorder, recurrent severe without psychotic features: Secondary | ICD-10-CM | POA: Diagnosis not present

## 2017-06-19 DIAGNOSIS — M545 Low back pain: Secondary | ICD-10-CM | POA: Diagnosis not present

## 2017-06-19 DIAGNOSIS — G8929 Other chronic pain: Secondary | ICD-10-CM | POA: Diagnosis not present

## 2017-06-19 DIAGNOSIS — F319 Bipolar disorder, unspecified: Secondary | ICD-10-CM | POA: Diagnosis not present

## 2017-06-19 DIAGNOSIS — F419 Anxiety disorder, unspecified: Secondary | ICD-10-CM | POA: Diagnosis not present

## 2017-06-19 DIAGNOSIS — Z888 Allergy status to other drugs, medicaments and biological substances status: Secondary | ICD-10-CM | POA: Diagnosis not present

## 2017-06-19 DIAGNOSIS — R45851 Suicidal ideations: Secondary | ICD-10-CM | POA: Diagnosis not present

## 2017-06-19 DIAGNOSIS — L03213 Periorbital cellulitis: Secondary | ICD-10-CM | POA: Diagnosis not present

## 2017-06-19 DIAGNOSIS — F418 Other specified anxiety disorders: Secondary | ICD-10-CM | POA: Diagnosis not present

## 2017-06-19 DIAGNOSIS — I10 Essential (primary) hypertension: Secondary | ICD-10-CM | POA: Diagnosis not present

## 2017-06-19 DIAGNOSIS — Z886 Allergy status to analgesic agent status: Secondary | ICD-10-CM | POA: Diagnosis not present

## 2017-06-19 DIAGNOSIS — R220 Localized swelling, mass and lump, head: Secondary | ICD-10-CM | POA: Diagnosis not present

## 2017-06-19 DIAGNOSIS — F23 Brief psychotic disorder: Secondary | ICD-10-CM | POA: Diagnosis not present

## 2017-06-19 DIAGNOSIS — M79606 Pain in leg, unspecified: Secondary | ICD-10-CM | POA: Diagnosis not present

## 2017-06-20 DIAGNOSIS — F332 Major depressive disorder, recurrent severe without psychotic features: Secondary | ICD-10-CM | POA: Diagnosis not present

## 2017-08-13 DIAGNOSIS — I10 Essential (primary) hypertension: Secondary | ICD-10-CM | POA: Diagnosis not present

## 2017-08-13 DIAGNOSIS — I517 Cardiomegaly: Secondary | ICD-10-CM | POA: Diagnosis not present

## 2017-08-13 DIAGNOSIS — R45851 Suicidal ideations: Secondary | ICD-10-CM | POA: Diagnosis not present

## 2017-08-13 DIAGNOSIS — F319 Bipolar disorder, unspecified: Secondary | ICD-10-CM | POA: Diagnosis not present

## 2017-08-13 DIAGNOSIS — E876 Hypokalemia: Secondary | ICD-10-CM | POA: Diagnosis not present

## 2017-08-13 DIAGNOSIS — R197 Diarrhea, unspecified: Secondary | ICD-10-CM | POA: Diagnosis not present

## 2017-08-13 DIAGNOSIS — E669 Obesity, unspecified: Secondary | ICD-10-CM | POA: Diagnosis not present

## 2017-08-13 DIAGNOSIS — K529 Noninfective gastroenteritis and colitis, unspecified: Secondary | ICD-10-CM | POA: Diagnosis not present

## 2017-08-13 DIAGNOSIS — E119 Type 2 diabetes mellitus without complications: Secondary | ICD-10-CM | POA: Diagnosis not present

## 2017-08-13 DIAGNOSIS — A084 Viral intestinal infection, unspecified: Secondary | ICD-10-CM | POA: Diagnosis not present

## 2017-08-13 DIAGNOSIS — R109 Unspecified abdominal pain: Secondary | ICD-10-CM | POA: Diagnosis not present

## 2017-08-13 DIAGNOSIS — R1084 Generalized abdominal pain: Secondary | ICD-10-CM | POA: Diagnosis not present

## 2017-08-13 DIAGNOSIS — Z888 Allergy status to other drugs, medicaments and biological substances status: Secondary | ICD-10-CM | POA: Diagnosis not present

## 2017-08-13 DIAGNOSIS — R0602 Shortness of breath: Secondary | ICD-10-CM | POA: Diagnosis not present

## 2017-08-13 DIAGNOSIS — F329 Major depressive disorder, single episode, unspecified: Secondary | ICD-10-CM | POA: Diagnosis not present

## 2017-08-16 DIAGNOSIS — I10 Essential (primary) hypertension: Secondary | ICD-10-CM | POA: Diagnosis not present

## 2017-08-16 DIAGNOSIS — F339 Major depressive disorder, recurrent, unspecified: Secondary | ICD-10-CM | POA: Diagnosis not present

## 2017-08-16 DIAGNOSIS — E119 Type 2 diabetes mellitus without complications: Secondary | ICD-10-CM | POA: Diagnosis not present

## 2017-08-17 DIAGNOSIS — I10 Essential (primary) hypertension: Secondary | ICD-10-CM | POA: Diagnosis not present

## 2017-08-17 DIAGNOSIS — E119 Type 2 diabetes mellitus without complications: Secondary | ICD-10-CM | POA: Diagnosis not present

## 2017-08-17 DIAGNOSIS — F339 Major depressive disorder, recurrent, unspecified: Secondary | ICD-10-CM | POA: Diagnosis not present

## 2017-08-17 DIAGNOSIS — J019 Acute sinusitis, unspecified: Secondary | ICD-10-CM | POA: Diagnosis not present

## 2017-08-18 DIAGNOSIS — F339 Major depressive disorder, recurrent, unspecified: Secondary | ICD-10-CM | POA: Diagnosis not present

## 2017-08-19 DIAGNOSIS — F339 Major depressive disorder, recurrent, unspecified: Secondary | ICD-10-CM | POA: Diagnosis not present

## 2017-08-20 DIAGNOSIS — F339 Major depressive disorder, recurrent, unspecified: Secondary | ICD-10-CM | POA: Diagnosis not present

## 2017-08-21 DIAGNOSIS — F339 Major depressive disorder, recurrent, unspecified: Secondary | ICD-10-CM | POA: Diagnosis not present

## 2017-08-22 DIAGNOSIS — F339 Major depressive disorder, recurrent, unspecified: Secondary | ICD-10-CM | POA: Diagnosis not present

## 2017-08-23 DIAGNOSIS — F339 Major depressive disorder, recurrent, unspecified: Secondary | ICD-10-CM | POA: Diagnosis not present

## 2017-08-24 DIAGNOSIS — F339 Major depressive disorder, recurrent, unspecified: Secondary | ICD-10-CM | POA: Diagnosis not present

## 2017-08-25 DIAGNOSIS — F339 Major depressive disorder, recurrent, unspecified: Secondary | ICD-10-CM | POA: Diagnosis not present

## 2017-09-07 DIAGNOSIS — R197 Diarrhea, unspecified: Secondary | ICD-10-CM | POA: Diagnosis not present

## 2017-09-07 DIAGNOSIS — M7989 Other specified soft tissue disorders: Secondary | ICD-10-CM | POA: Diagnosis not present

## 2017-09-07 DIAGNOSIS — M545 Low back pain: Secondary | ICD-10-CM | POA: Diagnosis not present

## 2017-09-07 DIAGNOSIS — M25572 Pain in left ankle and joints of left foot: Secondary | ICD-10-CM | POA: Diagnosis not present

## 2017-09-07 DIAGNOSIS — I10 Essential (primary) hypertension: Secondary | ICD-10-CM | POA: Diagnosis not present

## 2017-09-07 DIAGNOSIS — Z043 Encounter for examination and observation following other accident: Secondary | ICD-10-CM | POA: Diagnosis not present

## 2017-09-07 DIAGNOSIS — M25571 Pain in right ankle and joints of right foot: Secondary | ICD-10-CM | POA: Diagnosis not present

## 2017-09-11 DIAGNOSIS — I10 Essential (primary) hypertension: Secondary | ICD-10-CM | POA: Diagnosis not present

## 2017-09-11 DIAGNOSIS — F333 Major depressive disorder, recurrent, severe with psychotic symptoms: Secondary | ICD-10-CM | POA: Diagnosis not present

## 2017-09-12 DIAGNOSIS — F333 Major depressive disorder, recurrent, severe with psychotic symptoms: Secondary | ICD-10-CM | POA: Diagnosis not present

## 2017-09-13 DIAGNOSIS — M4800 Spinal stenosis, site unspecified: Secondary | ICD-10-CM | POA: Diagnosis not present

## 2017-09-13 DIAGNOSIS — F17210 Nicotine dependence, cigarettes, uncomplicated: Secondary | ICD-10-CM | POA: Diagnosis not present

## 2017-09-13 DIAGNOSIS — I10 Essential (primary) hypertension: Secondary | ICD-10-CM | POA: Diagnosis not present

## 2017-09-13 DIAGNOSIS — F333 Major depressive disorder, recurrent, severe with psychotic symptoms: Secondary | ICD-10-CM | POA: Diagnosis not present

## 2017-09-13 DIAGNOSIS — E875 Hyperkalemia: Secondary | ICD-10-CM | POA: Diagnosis not present

## 2017-09-13 DIAGNOSIS — G894 Chronic pain syndrome: Secondary | ICD-10-CM | POA: Diagnosis not present

## 2017-09-14 DIAGNOSIS — F333 Major depressive disorder, recurrent, severe with psychotic symptoms: Secondary | ICD-10-CM | POA: Diagnosis not present

## 2017-09-15 DIAGNOSIS — F333 Major depressive disorder, recurrent, severe with psychotic symptoms: Secondary | ICD-10-CM | POA: Diagnosis not present

## 2017-09-16 DIAGNOSIS — F333 Major depressive disorder, recurrent, severe with psychotic symptoms: Secondary | ICD-10-CM | POA: Diagnosis not present

## 2017-09-17 DIAGNOSIS — F333 Major depressive disorder, recurrent, severe with psychotic symptoms: Secondary | ICD-10-CM | POA: Diagnosis not present

## 2017-09-18 DIAGNOSIS — F333 Major depressive disorder, recurrent, severe with psychotic symptoms: Secondary | ICD-10-CM | POA: Diagnosis not present

## 2017-09-19 DIAGNOSIS — F333 Major depressive disorder, recurrent, severe with psychotic symptoms: Secondary | ICD-10-CM | POA: Diagnosis not present

## 2017-11-13 DIAGNOSIS — Z0389 Encounter for observation for other suspected diseases and conditions ruled out: Secondary | ICD-10-CM | POA: Diagnosis not present

## 2018-01-03 DIAGNOSIS — D649 Anemia, unspecified: Secondary | ICD-10-CM | POA: Diagnosis not present

## 2018-01-03 DIAGNOSIS — E785 Hyperlipidemia, unspecified: Secondary | ICD-10-CM | POA: Diagnosis not present

## 2018-01-03 DIAGNOSIS — F331 Major depressive disorder, recurrent, moderate: Secondary | ICD-10-CM | POA: Diagnosis not present

## 2018-01-10 DIAGNOSIS — M1990 Unspecified osteoarthritis, unspecified site: Secondary | ICD-10-CM | POA: Diagnosis not present

## 2018-01-10 DIAGNOSIS — R2681 Unsteadiness on feet: Secondary | ICD-10-CM | POA: Diagnosis not present

## 2018-01-10 DIAGNOSIS — F39 Unspecified mood [affective] disorder: Secondary | ICD-10-CM | POA: Diagnosis not present

## 2018-01-10 DIAGNOSIS — F603 Borderline personality disorder: Secondary | ICD-10-CM | POA: Diagnosis not present

## 2019-04-15 DIAGNOSIS — R52 Pain, unspecified: Secondary | ICD-10-CM | POA: Diagnosis not present

## 2019-04-15 DIAGNOSIS — I10 Essential (primary) hypertension: Secondary | ICD-10-CM | POA: Diagnosis not present

## 2019-04-15 DIAGNOSIS — R0902 Hypoxemia: Secondary | ICD-10-CM | POA: Diagnosis not present

## 2019-04-24 DIAGNOSIS — W19XXXA Unspecified fall, initial encounter: Secondary | ICD-10-CM | POA: Diagnosis not present

## 2019-04-24 DIAGNOSIS — M545 Low back pain: Secondary | ICD-10-CM | POA: Diagnosis not present

## 2019-04-24 DIAGNOSIS — I10 Essential (primary) hypertension: Secondary | ICD-10-CM | POA: Diagnosis not present

## 2019-04-24 DIAGNOSIS — R457 State of emotional shock and stress, unspecified: Secondary | ICD-10-CM | POA: Diagnosis not present
